# Patient Record
Sex: Female | Born: 1965 | Race: White | Hispanic: No | Marital: Married | Smoking: Never smoker
Health system: Southern US, Community
[De-identification: ages and names within clinical notes are randomized; demographics above are authoritative.]

## PROBLEM LIST (undated history)

## (undated) DIAGNOSIS — M2550 Pain in unspecified joint: Secondary | ICD-10-CM

## (undated) DIAGNOSIS — N3941 Urge incontinence: Secondary | ICD-10-CM

## (undated) DIAGNOSIS — F32A Depression, unspecified: Secondary | ICD-10-CM

## (undated) DIAGNOSIS — G8929 Other chronic pain: Secondary | ICD-10-CM

## (undated) DIAGNOSIS — M351 Other overlap syndromes: Secondary | ICD-10-CM

## (undated) DIAGNOSIS — D128 Benign neoplasm of rectum: Secondary | ICD-10-CM

## (undated) DIAGNOSIS — F419 Anxiety disorder, unspecified: Secondary | ICD-10-CM

## (undated) DIAGNOSIS — F329 Major depressive disorder, single episode, unspecified: Secondary | ICD-10-CM

## (undated) DIAGNOSIS — H04129 Dry eye syndrome of unspecified lacrimal gland: Secondary | ICD-10-CM

---

## 1990-07-14 HISTORY — PX: NASAL SINUS SURGERY: SHX719

## 1998-08-17 ENCOUNTER — Other Ambulatory Visit: Admission: RE | Admit: 1998-08-17 | Discharge: 1998-08-17 | Payer: Self-pay | Admitting: Obstetrics and Gynecology

## 1999-03-09 ENCOUNTER — Encounter (INDEPENDENT_AMBULATORY_CARE_PROVIDER_SITE_OTHER): Payer: Self-pay | Admitting: Specialist

## 1999-03-09 ENCOUNTER — Inpatient Hospital Stay (HOSPITAL_COMMUNITY): Admission: AD | Admit: 1999-03-09 | Discharge: 1999-03-11 | Payer: Self-pay | Admitting: Obstetrics and Gynecology

## 1999-04-24 ENCOUNTER — Other Ambulatory Visit: Admission: RE | Admit: 1999-04-24 | Discharge: 1999-04-24 | Payer: Self-pay | Admitting: Obstetrics and Gynecology

## 2000-05-26 ENCOUNTER — Other Ambulatory Visit: Admission: RE | Admit: 2000-05-26 | Discharge: 2000-05-26 | Payer: Self-pay | Admitting: Obstetrics and Gynecology

## 2001-08-10 ENCOUNTER — Other Ambulatory Visit: Admission: RE | Admit: 2001-08-10 | Discharge: 2001-08-10 | Payer: Self-pay | Admitting: Obstetrics and Gynecology

## 2002-10-10 ENCOUNTER — Other Ambulatory Visit: Admission: RE | Admit: 2002-10-10 | Discharge: 2002-10-10 | Payer: Self-pay | Admitting: Obstetrics and Gynecology

## 2003-11-30 ENCOUNTER — Other Ambulatory Visit: Admission: RE | Admit: 2003-11-30 | Discharge: 2003-11-30 | Payer: Self-pay | Admitting: Obstetrics and Gynecology

## 2004-03-13 ENCOUNTER — Ambulatory Visit (HOSPITAL_COMMUNITY): Admission: RE | Admit: 2004-03-13 | Discharge: 2004-03-13 | Payer: Self-pay | Admitting: Family Medicine

## 2004-03-25 ENCOUNTER — Ambulatory Visit (HOSPITAL_COMMUNITY): Admission: RE | Admit: 2004-03-25 | Discharge: 2004-03-25 | Payer: Self-pay | Admitting: Family Medicine

## 2004-04-09 ENCOUNTER — Ambulatory Visit (HOSPITAL_COMMUNITY): Admission: RE | Admit: 2004-04-09 | Discharge: 2004-04-09 | Payer: Self-pay | Admitting: Family Medicine

## 2004-12-16 ENCOUNTER — Other Ambulatory Visit: Admission: RE | Admit: 2004-12-16 | Discharge: 2004-12-16 | Payer: Self-pay | Admitting: Obstetrics and Gynecology

## 2006-04-08 IMAGING — CT CT PELVIS W/ CM
1 of 2 series · 14 of 32 positions shown, 18 images · IV contrast (omnipaque)
Comparison: none

CLINICAL DATA: Abdominal pain / diarrhea.  
 CT ABDOMEN AND PELVIS WITH CONTRAST
TECHNIQUE: Multidetector helical imaging carried out through the abdomen and pelvis utilizing oral and IV contrast ? 100 cc Omnipaque 300.  
 ABDOMEN WITH CONTRAST 
 Lung bases clear.  There is a single lesion of the liver in the posterolateral aspect of the right lobe, seen on images 25 through 27 of the initial series, and 14 through 16 of the delayed series.   The lesion measures approximately 18 x 10 mm.  It is ovoid in shape and morphologically looks like a cyst.  However, Hounsfield units on both the early and delayed images, are in the 40s to 50s.  It does not significantly ?fill in? on the delayed images, suggesting that it is not a hemangioma.  I have no other imaging studies on this patient.  If the patient is not known to have this lesion based on other exams, MRI is warranted to further characterize this lesion.  There are no other lesions of the liver.  Spleen, pancreas, and adrenals normal.  Early and delayed renal imaging showed no parenchymal mass, parenchymal lesions or obstruction.  No adenopathy or ascites.  
 IMPRESSION
 Normal except for an 18 x 10 mm lesion in the right lobe of the liver that does not appear to be a simple cyst. 
 PELVIS WITH CONTRAST
 I cannot image the appendix.  I see no inflammatory changes to suggest appendicitis.  However, there may be some slight thickening in the wall of the distal and terminal ileum.  This is best seen on image #67 in the terminal ileum. 
 The distal ileum either shows wall thickening or a small amount of pelvic ascites.  In fact there is some free fluid in the cul-de-sac and so the findings of the distal ileum are soft.  The questionable findings in the terminal ileum are also nondefinitive.  Given the patient's history of diarrhea, one might consider a small bowel follow-through for further assessment, if inflammatory bowel disease is a consideration. 
 Otherwise there are no pathological findings. 
 There is a small amount of fluid in the cul-de-sac, and there are possible subtle changes of inflammatory bowel disease in the distal and terminal ileum.  See above discussion.

[Series 2: abd pelvis · axial · 0.62mm/px · z∈[-425,-60]mm · 14 of 80 slices shown, 18 images]
[im 4/80  soft-tissue]
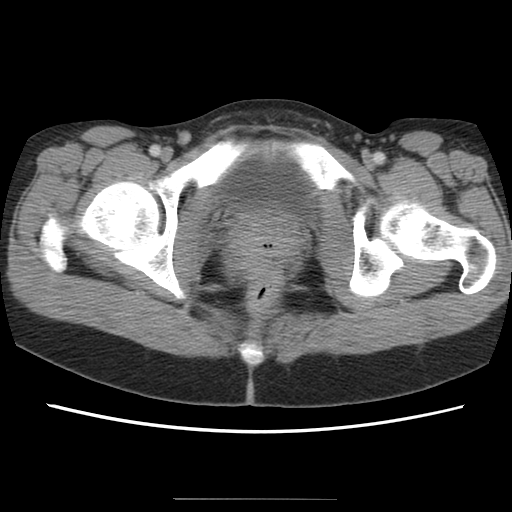
[im 4/80  bone]
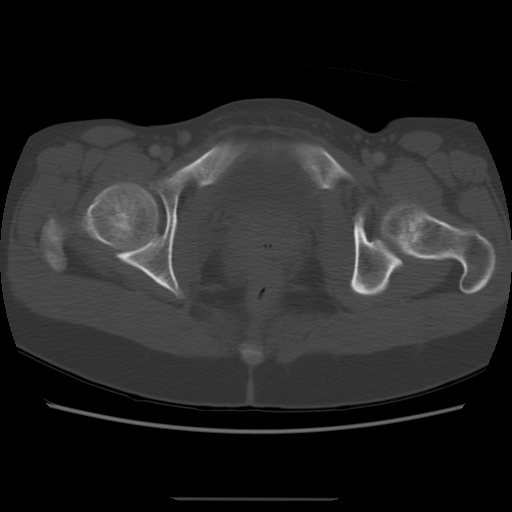
[im 12/80  soft-tissue]
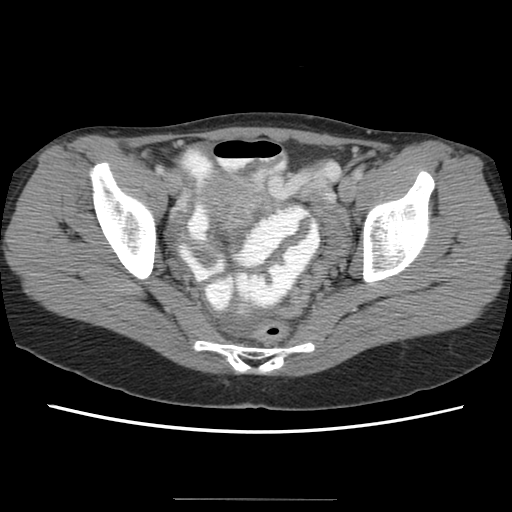
[im 19/80  soft-tissue]
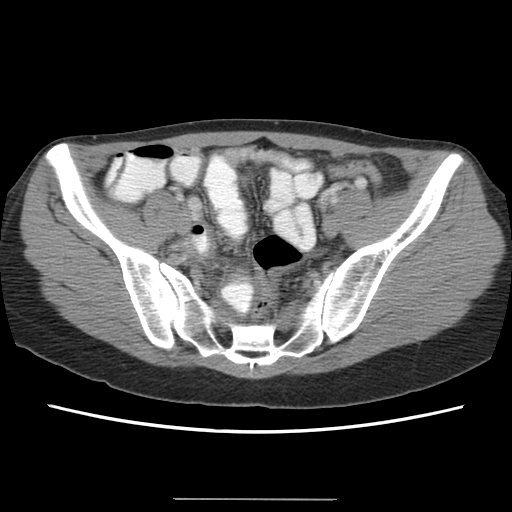
[im 23/80  soft-tissue]
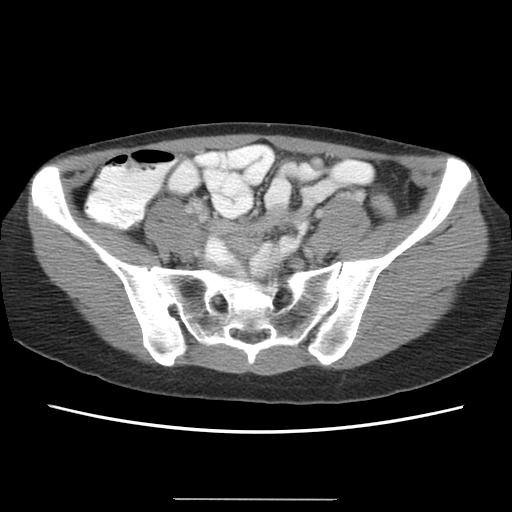
[im 31/80  soft-tissue]
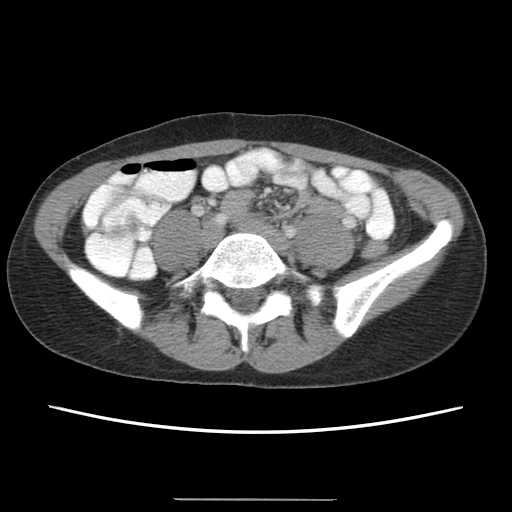
[im 38/80  soft-tissue]
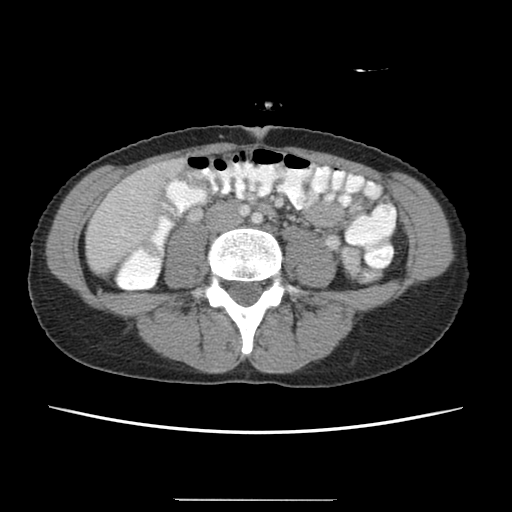
[im 42/80  soft-tissue]
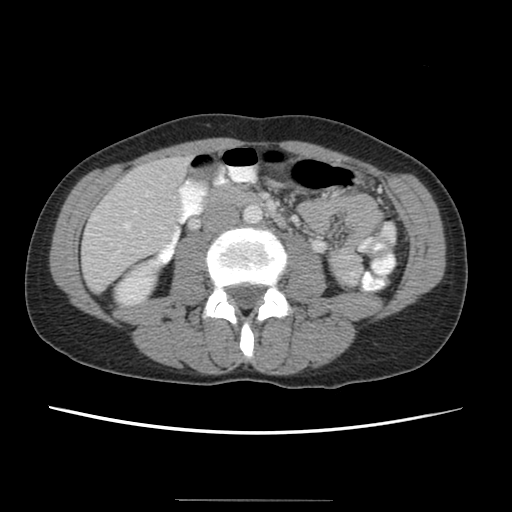
[im 49/80  soft-tissue]
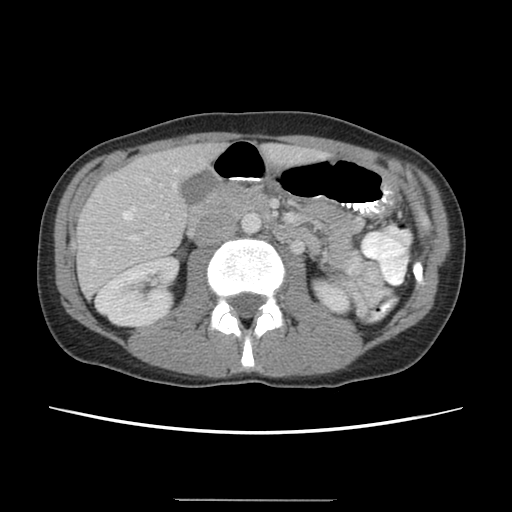
[im 57/80  soft-tissue]
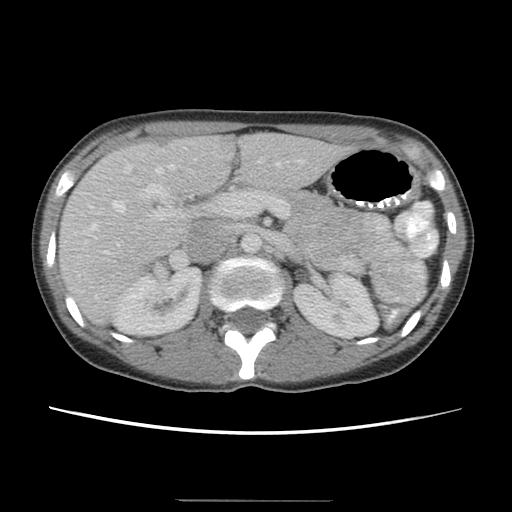
[im 57/80  bone]
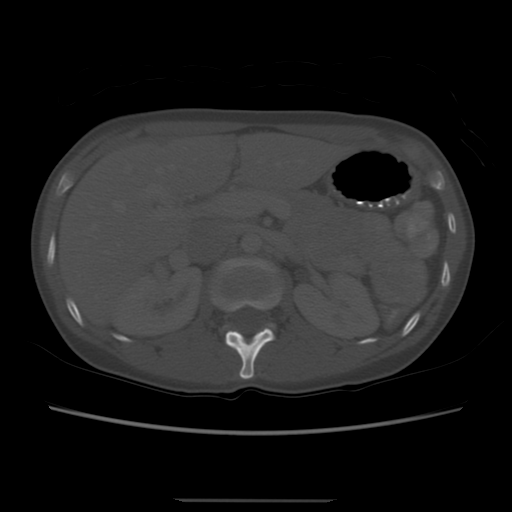
[im 61/80  soft-tissue]
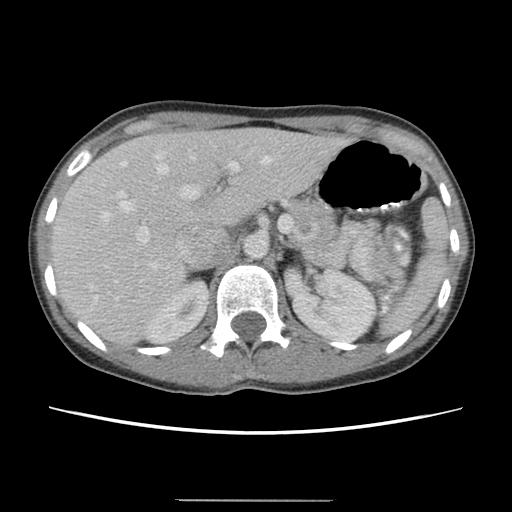
[im 64/80  lung]
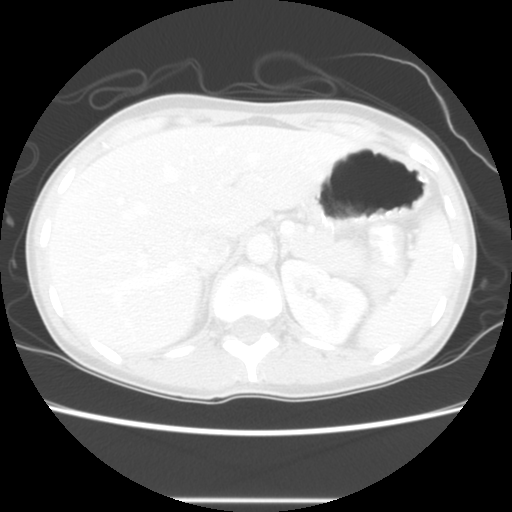
[im 68/80  soft-tissue]
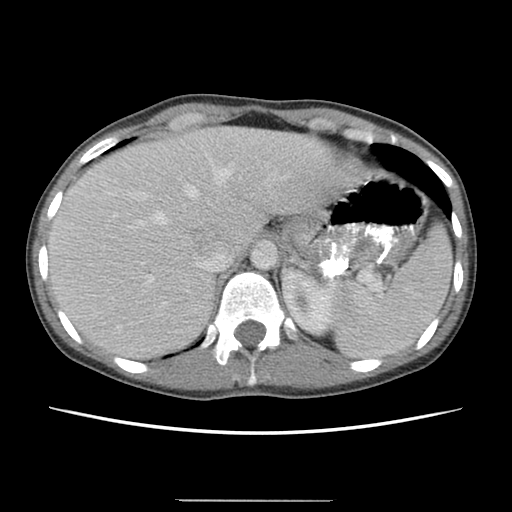
[im 68/80  lung]
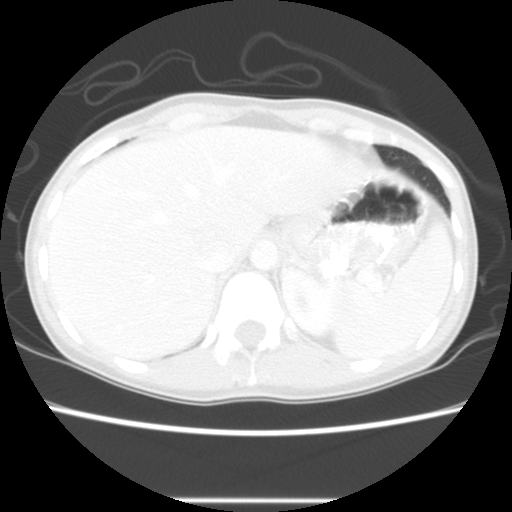
[im 72/80  lung]
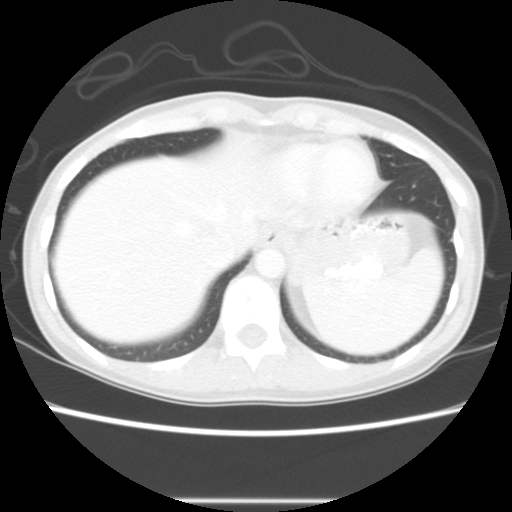
[im 76/80  soft-tissue]
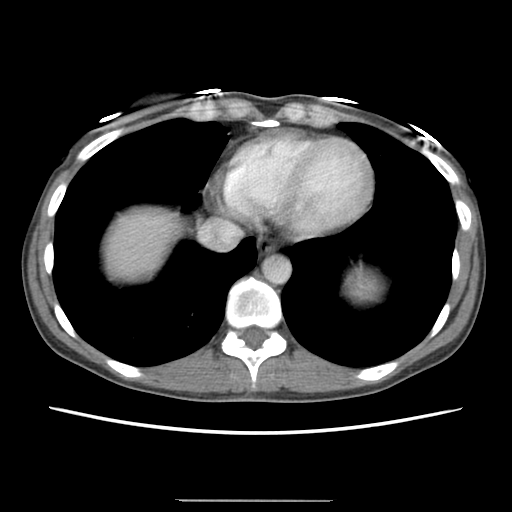
[im 76/80  lung]
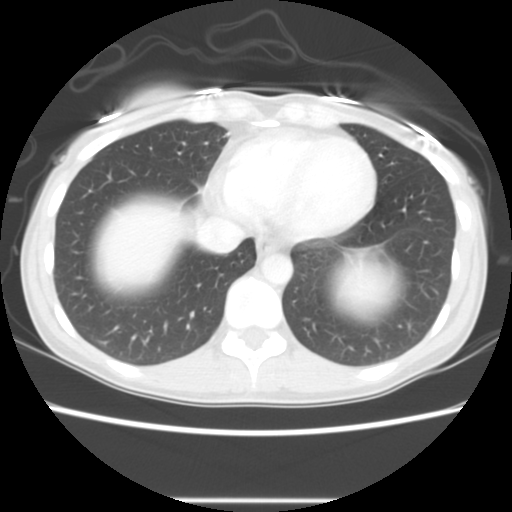

[14 of 32 positions shown; findings below may reference images not displayed]

## 2007-05-11 ENCOUNTER — Other Ambulatory Visit: Admission: RE | Admit: 2007-05-11 | Discharge: 2007-05-11 | Payer: Self-pay | Admitting: Obstetrics and Gynecology

## 2007-06-02 ENCOUNTER — Encounter: Admission: RE | Admit: 2007-06-02 | Discharge: 2007-06-02 | Payer: Self-pay | Admitting: Obstetrics and Gynecology

## 2009-06-27 IMAGING — MG MM SCREEN MAMMOGRAM BILATERAL
4 series · 4 of 4 positions shown · non-contrast
Comparison: none

DG SCREEN MAMMOGRAM BILATERAL
Bilateral CC and MLO view(s) were taken.

DIGITAL SCREENING MAMMOGRAM WITH CAD:
The breast tissue is extremely dense.  No masses or malignant type calcifications are identified.  
Compared with prior studies.

[R CC]
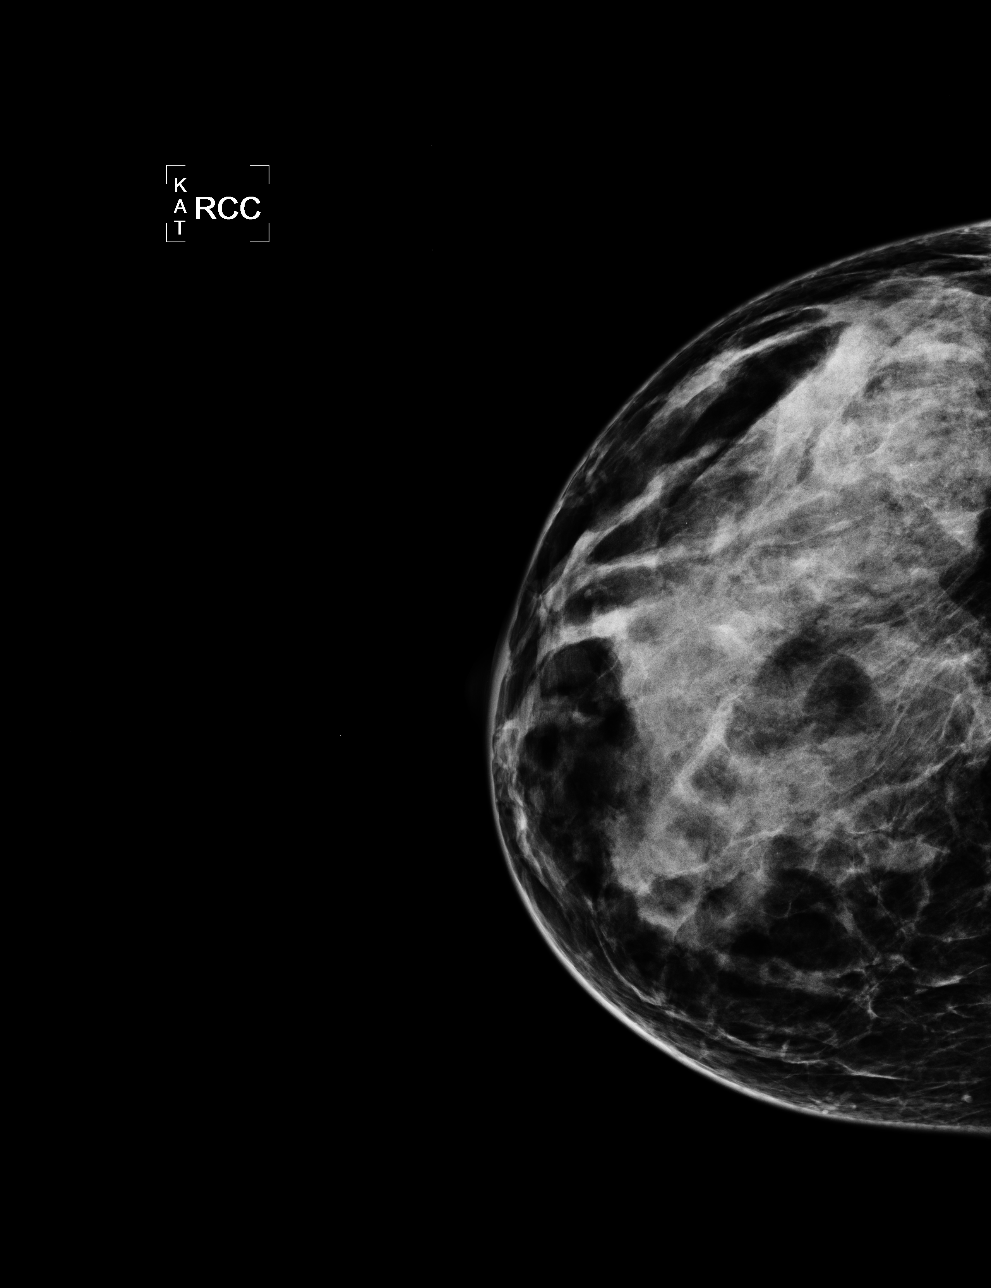

[L CC]
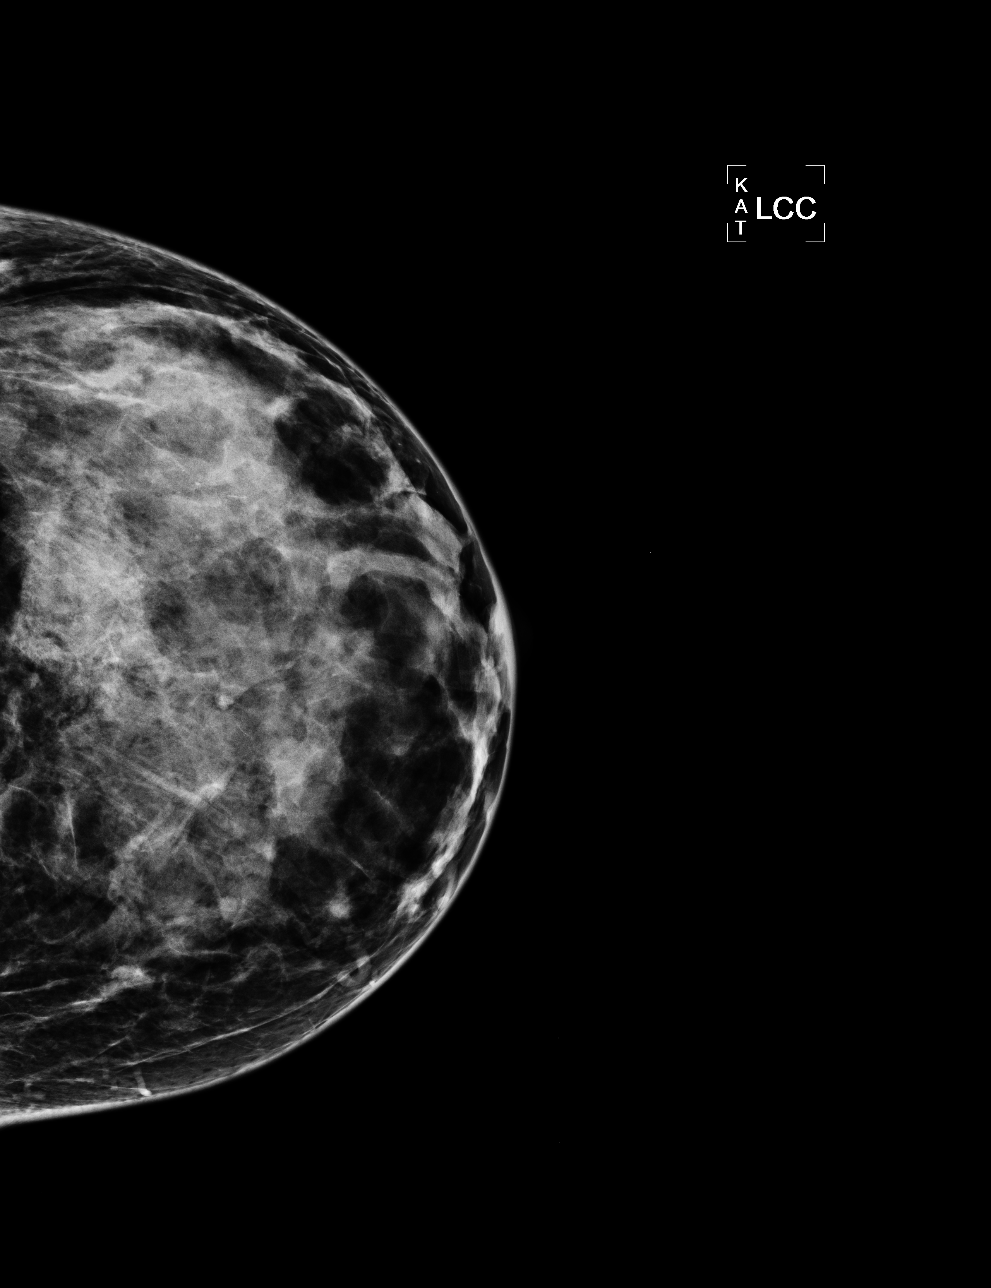

[L MLO]
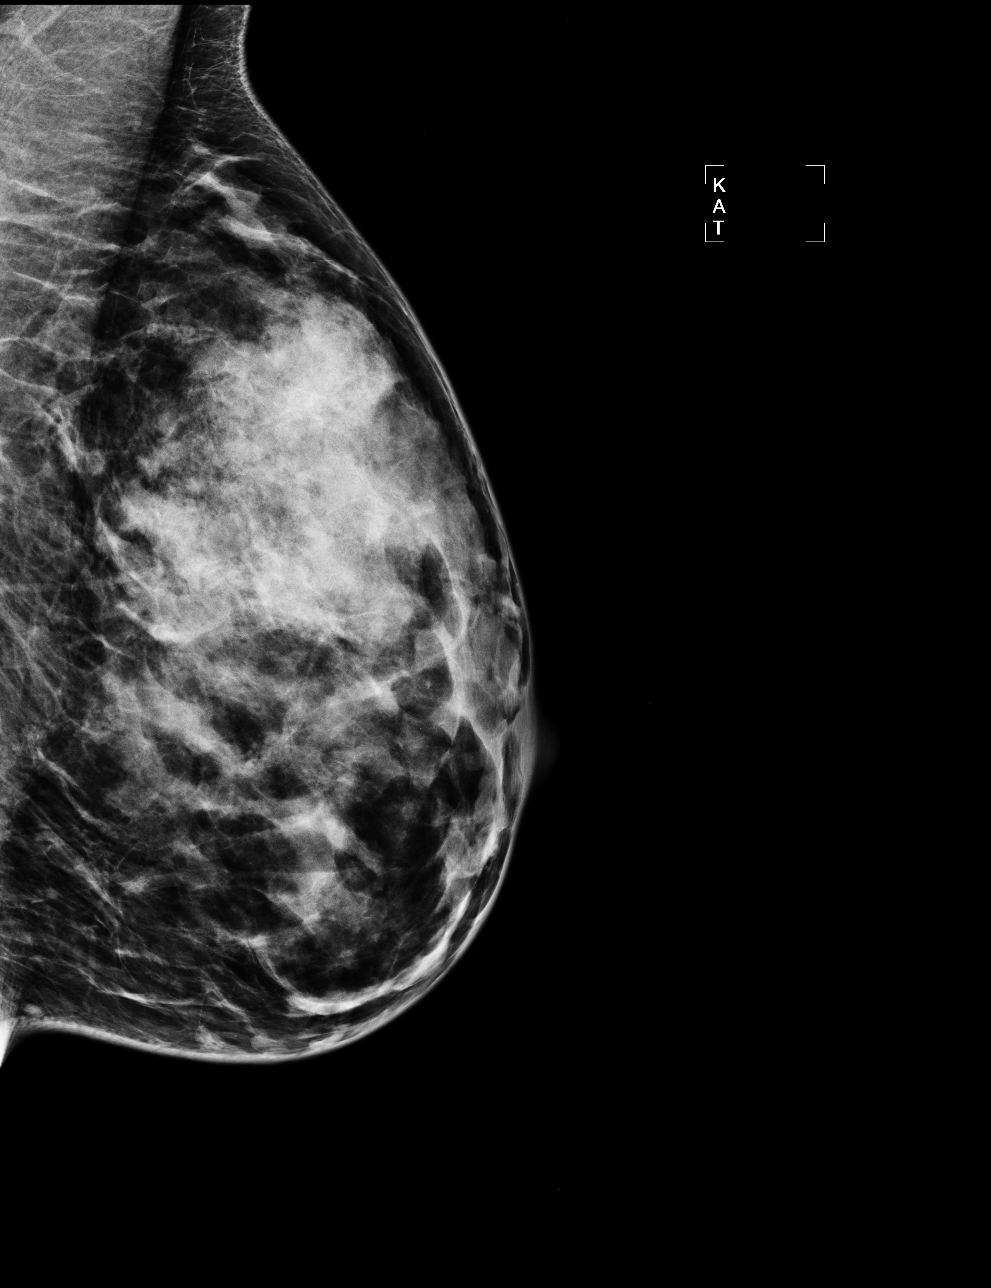

[R MLO]
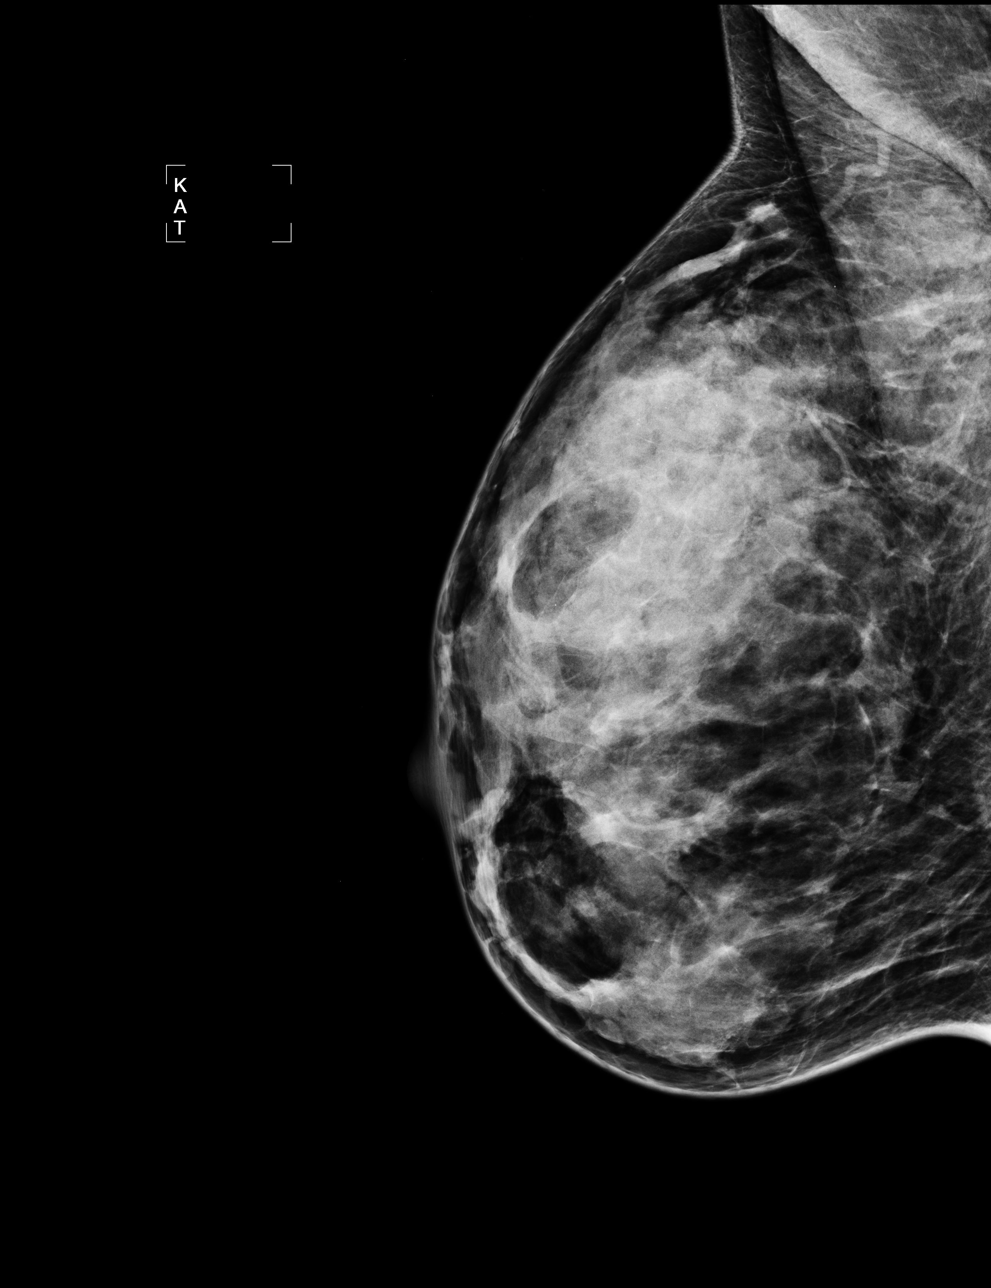

[4 of 4 positions shown; findings below may reference images not displayed]

IMPRESSION: No specific mammographic evidence of malignancy.  Next screening mammogram is recommended in one 
year.

ASSESSMENT: Negative - BI-RADS 1

Screening mammogram in 1 year.
ANALYZED BY COMPUTER AIDED DETECTION. , THIS PROCEDURE WAS A DIGITAL MAMMOGRAM.

## 2010-08-04 ENCOUNTER — Encounter: Payer: Self-pay | Admitting: Obstetrics and Gynecology

## 2012-05-28 ENCOUNTER — Encounter (HOSPITAL_COMMUNITY): Payer: Self-pay | Admitting: *Deleted

## 2012-05-28 ENCOUNTER — Ambulatory Visit (HOSPITAL_COMMUNITY)
Admission: RE | Admit: 2012-05-28 | Discharge: 2012-05-28 | Disposition: A | Discharge status: Home / Self Care | Payer: 59 | Attending: Psychiatry | Admitting: Psychiatry

## 2012-05-28 DIAGNOSIS — F331 Major depressive disorder, recurrent, moderate: Secondary | ICD-10-CM | POA: Insufficient documentation

## 2012-05-28 DIAGNOSIS — F411 Generalized anxiety disorder: Secondary | ICD-10-CM | POA: Insufficient documentation

## 2012-05-28 NOTE — H&P (Signed)
Behavioral Health Medical Screening Exam  Crystal Davenport is an 46 y.o. female.  Review of Systems  Constitutional: Negative.   HENT: Negative for hearing loss, ear pain, nosebleeds, congestion, sore throat, tinnitus and ear discharge.   Eyes: Negative.   Respiratory: Negative.  Negative for stridor.   Cardiovascular: Positive for chest pain and palpitations. Negative for orthopnea, claudication, leg swelling and PND.       States during panic attack will get chest pain and fast heart beat  Gastrointestinal: Negative.   Genitourinary: Negative.   Musculoskeletal: Negative.   Skin: Negative.   Neurological: Positive for headaches (States that she has headache now and had one yesterday.  States that she does not have  hx of migraines).  Endo/Heme/Allergies: Negative.   Psychiatric/Behavioral: Positive for depression (Rates depression 8/10). Negative for suicidal ideas (Denies SI and HI), hallucinations and memory loss. The patient is nervous/anxious (Rates anxiety 9/10). The patient does not have insomnia.     Physical Exam  Constitutional: She is oriented to person, place, and time. She appears well-developed and well-nourished.  HENT:  Head: Normocephalic.  Eyes: Pupils are equal, round, and reactive to light. Right eye exhibits no discharge. Left eye exhibits no discharge.  Neck: Normal range of motion.  Cardiovascular: Normal rate, regular rhythm, normal heart sounds and intact distal pulses.  Exam reveals no gallop and no friction rub.   No murmur heard.      Patient blood pressure right arm sitting 167/90 and left arm sitting 137/92.  Patient stated that she was told by a doctor that her blood pressure was increasing but had never been treated for HTN.  Discussed monitoring blood pressure at home and recording it.  Take the recordings to PCP.  Informed to set up an appointment with PCP to assess blood pressure  Respiratory: Effort normal and breath sounds normal. No respiratory  distress. She has no wheezes. She has no rales. She exhibits no tenderness.  GI: Soft. Bowel sounds are normal.  Musculoskeletal: Normal range of motion.  Neurological: She is alert and oriented to person, place, and time. She has normal reflexes. She displays normal reflexes. She exhibits normal muscle tone.  Skin: Skin is warm and dry.  Psychiatric:       Patient mood/affect: Tearful and depressed Behavior: abnormal (depressed) Thought content: coherent Judgement: Normal Patient stated that she was experience depression related to several changes a house full of teenager and an elderly who was suffering from anxiety/depression and changes at the work place.  Stated that she did not feel that she needed inpatient care just wanted to set up something on an outpatient basis.    There were no vitals taken for this visit.  Recommendations:  Based on my evaluation the patient does not appear to have an emergency medical condition. Blood pressure discussed with patient.  Patient will monitor BP at home and set up an appontment with PCP.  Resource information given to patient for OP treatment.  Also infromed patient if she reached the point that she felt that she would benifit from inpatient treatment that she could  come back and enter voluntary.  Pateint was able to sight the no harm contract and patient denies SI, HI and HA.  Crystal Davenport 05/28/2012, 12:52 PM

## 2012-05-28 NOTE — BH Assessment (Signed)
Assessment Note   Crystal Davenport is a 46 y.o. married white female.  She presents at [facility] complaining of depression, adding that she feels "rejected by God."  Pt notes several stressors, starting with estrangement from her mother who has been a Chartered loss adjuster for years, and who neglected pt in childhood.  Her father left the household when pt was 25 y/o and had little contact with her thereafter.  He died abruptly of cancer on 27-Apr-2012 after a short course of treatment, and pt feels that her issues with him are unresolved.  Moreover, in making funeral arrangements, his wife made little to no effort to accommodate the needs of the pt and her siblings.  Pt reports that her daughters, ages 76, 46, and 14, are very intelligent and high achieving, and the family has made sacrifices on their behalves, including a recent home sale and move into a better school district.  This entailed purchasing a larger home that pt characterizes as a "fixer upper."  The move was very stressful, and pt states, "it was hell."  Yesterday, 05/27/2012, pt discovered that the 35 y/o daughter has been procrastinating on completing the college application process to the point that this may jeopardize her academic future.  Pt confronted the daughter, who responded by telling pt that she hates her, which was very upsetting to the pt.  Pt endorses depression with symptoms as indicated in the "risk to self" assessment below.  These have worsened significantly since the death of her father.  Pt states, "I'm not doing anybody any good."  She endorses SI, but only passive, saying, "I just wish I would die naturally."  She denies having a suicide plan, or any history of suicide attempts or of self mutilation. She is capable of contracting for safety at this time.  She denies any current or past history of HI, physical aggression, AH/VH, or substance abuse.  She demonstrates no delusional thought at this time.  Pt holds a bachelors degree in psychology,  and works as an Print production planner for [employer].  She finds the work very gratifying and is on good terms with therapists with whom she works: "Work is the one place where I do feel appreciated."  Pt reports that her coworkers have some understanding of her depression, but not of its severity.  She identifies her spouse as her main social support, and their relationship is intact, although she reports that she has recently placed more responsibility on him.  Pt has never been hospitalized for psychiatric treatment.  She and her spouse saw an outpt therapist in the early 1990's prior to being married.  Pt attributed this to the dysfunction in which she grew up, saying that she wanted her spouse to understand what he was getting into with her.  Last spring, pt's daughter saw a therapist for two sessions, and pt also saw the therapist for a single session, but mostly in the interest of advancing the daughter's treatment.  Pt is prescribed Xanax by her PCP.  She does not see a psychiatrist.  Axis I: Major Depressive Disorder, recurrent, moderate 296.32; Anxiety Disorder NOS 300.00 Axis II: Deferred 799.9 Axis III:  Past Medical History  Diagnosis Date  . Hypertension 05/28/2012    Intermittant problem   Axis IV: housing problems, problems with primary support group and parent-child relational problems and problems related to grieving Axis V: GAF = 45  Past Medical History:  Past Medical History  Diagnosis Date  . Hypertension 05/28/2012  Intermittant problem    No past surgical history on file.  Family History: No family history on file.  Social History:  reports that she has never smoked. She has never used smokeless tobacco. She reports that she does not drink alcohol or use illicit drugs.  Additional Social History:  Alcohol / Drug Use Pain Medications: Denies Prescriptions: Denies Over the Counter: Denies History of alcohol / drug use?: No history of alcohol / drug abuse  CIWA:    COWS:    Allergies: Allergies no known allergies  Home Medications:  (Not in a hospital admission)  OB/GYN Status:  No LMP recorded.  General Assessment Data Location of Assessment: [facility] Living Arrangements: Spouse/significant other;Children (Spouse, daughters ages 15, 40, 37 y/o) Can pt return to current living arrangement?: Yes Admission Status: Voluntary Is patient capable of signing voluntary admission?: Yes Transfer from: Home Referral Source: Self/Family/Friend  Education Status Highest grade of school patient has completed: Bachelor's degree  Risk to self Suicidal Ideation: Yes-Currently Present Suicidal Intent: No Is patient at risk for suicide?: No Suicidal Plan?: No Access to Means: No What has been your use of drugs/alcohol within the last 12 months?: Denies Previous Attempts/Gestures: No How many times?: 0  Other Self Harm Risks: Reports passive SI only: "I just wish I would die naturally."  Contracts for safety. Triggers for Past Attempts: Other (Comment) (Not applicable) Intentional Self Injurious Behavior: None Family Suicide History: No (Mom & MGM: hoarding; daughter: anxiety) Recent stressful life event(s): Conflict (Comment);Loss (Comment);Other (Comment) (Daughter told pt she hates her; recent move into fixer upper) Persecutory voices/beliefs?: No Depression: Yes Depression Symptoms: Tearfulness;Fatigue;Guilt;Loss of interest in usual pleasures;Feeling worthless/self pity;Feeling angry/irritable (Hopelessness; "I feel rejected by God.") Substance abuse history and/or treatment for substance abuse?: No Suicide prevention information given to non-admitted patients: Yes  Risk to Others Homicidal Ideation: No Thoughts of Harm to Others: No Current Homicidal Intent: No Current Homicidal Plan: No Access to Homicidal Means: No Identified Victim: None History of harm to others?: No Assessment of Violence: None Noted Violent Behavior  Description: Calm/cooperative Does patient have access to weapons?: No (Denies access to guns.) Criminal Charges Pending?: No Does patient have a court date: No  Psychosis Hallucinations: None noted Delusions: None noted  Mental Status Report Appear/Hygiene:  (Neat, well groomed) Eye Contact: Fair Motor Activity: Unremarkable Speech: Other (Comment) (Unremarkable) Level of Consciousness: Alert Mood: Depressed;Other (Comment) (Tearful) Affect: Appropriate to circumstance Anxiety Level: None (Recent severe anxiety.) Thought Processes: Coherent;Relevant Judgement: Unimpaired Orientation: Person;Place;Time;Situation Obsessive Compulsive Thoughts/Behaviors: None  Cognitive Functioning Concentration: Normal Memory: Recent Intact;Remote Intact IQ: Average Insight: Fair Impulse Control: Good (Reports that coming to [facility] is her most impulsive behavior.) Appetite: Good Weight Loss: 0  Weight Gain: 0  Sleep: No Change Total Hours of Sleep: 7  Vegetative Symptoms: None  ADLScreening [facility]) Patient's cognitive ability adequate to safely complete daily activities?: Yes Patient able to express need for assistance with ADLs?: Yes Independently performs ADLs?: Yes (appropriate for developmental age)  Abuse/Neglect [facility]) Physical Abuse: Denies Verbal Abuse: Yes, past (Comment) (Mother is a Chartered loss adjuster; parents neglected pt in childhood.) Sexual Abuse: Denies  Prior Inpatient Therapy Prior Inpatient Therapy: No Prior Therapy Dates: None Prior Therapy Facilty/Provider(s): None Reason for Treatment: None  Prior Outpatient Therapy Prior Outpatient Therapy: Yes Prior Therapy Dates: Early 1990's: pre-marital counseling; pt discussed Hx of conflict with parents. Prior Therapy Facilty/Provider(s): Spring of 2013: spoke with daughter's counselor individually.  ADL Screening (condition at time of admission)  Patient's cognitive ability adequate to safely complete daily  activities?: Yes Patient able to express need for assistance with ADLs?: Yes Independently performs ADLs?: Yes (appropriate for developmental age) Weakness of Legs: None Weakness of Arms/Hands: None  Home Assistive Devices/Equipment Home Assistive Devices/Equipment: Contact lenses    Abuse/Neglect Assessment (Assessment to be complete while patient is alone) Physical Abuse: Denies Verbal Abuse: Yes, past (Comment) (Mother is a Chartered loss adjuster; parents neglected pt in childhood.) Sexual Abuse: Denies Self-Neglect: Denies Values / Beliefs Cultural Requests During Hospitalization: Other (comment) (Member of a church; feels rejected by God.)   Merchant navy officer (For Healthcare) Advance Directive: Patient does not have advance directive;Patient would like information Patient requests advance directive information: Advance directive packet given Pre-existing out of facility DNR order (yellow form or pink MOST form): No Nutrition Screen- [facility] Adult/WL/AP Patient's home diet: Regular Have you recently lost weight without trying?: No Have you been eating poorly because of a decreased appetite?: No Malnutrition Screening Tool Score: 0   Additional Information 1:1 In Past 12 Months?: No CIRT Risk: No Elopement Risk: No Does patient have medical clearance?: No     Disposition:  Disposition Disposition of Patient: Outpatient treatment Type of outpatient treatment: Psych Intensive Outpatient [facility]) Patient referred to: Other (Comment) (Also,[facility]) After consulting with Shuvon it was determined that pt is not currently a danger to others, nor to herself provided that she is able to contract for safety.  Pt signed Engineer, manufacturing systems.  Several outpt treatment options were discussed with pt including [facility] and routine outpt psychiatry and counseling.  Pt accepted printed information about [facility]-IOP, as  well as written referrals to [facility], [facility], and [place], [place].  She departed at 13:13 following MSE by [facility].  On Site Evaluation by:   Reviewed with Physician:  Assunta Found, FNP @ 12:50   Raphael Gibney 05/28/2012 2:43 PM

## 2013-07-14 HISTORY — PX: OTHER SURGICAL HISTORY: SHX169

## 2013-10-11 ENCOUNTER — Emergency Department (HOSPITAL_COMMUNITY)
Admission: EM | Admit: 2013-10-11 | Discharge: 2013-10-11 | Disposition: A | Discharge status: Home / Self Care | Payer: 59 | Attending: Emergency Medicine | Admitting: Emergency Medicine

## 2013-10-11 ENCOUNTER — Encounter (HOSPITAL_COMMUNITY): Payer: Self-pay | Admitting: Emergency Medicine

## 2013-10-11 DIAGNOSIS — T438X2A Poisoning by other psychotropic drugs, intentional self-harm, initial encounter: Secondary | ICD-10-CM

## 2013-10-11 DIAGNOSIS — IMO0002 Reserved for concepts with insufficient information to code with codable children: Secondary | ICD-10-CM | POA: Insufficient documentation

## 2013-10-11 DIAGNOSIS — T4271XA Poisoning by unspecified antiepileptic and sedative-hypnotic drugs, accidental (unintentional), initial encounter: Secondary | ICD-10-CM | POA: Insufficient documentation

## 2013-10-11 DIAGNOSIS — T424X4A Poisoning by benzodiazepines, undetermined, initial encounter: Secondary | ICD-10-CM | POA: Insufficient documentation

## 2013-10-11 DIAGNOSIS — T43502A Poisoning by unspecified antipsychotics and neuroleptics, intentional self-harm, initial encounter: Secondary | ICD-10-CM | POA: Insufficient documentation

## 2013-10-11 DIAGNOSIS — Z3202 Encounter for pregnancy test, result negative: Secondary | ICD-10-CM | POA: Insufficient documentation

## 2013-10-11 DIAGNOSIS — F329 Major depressive disorder, single episode, unspecified: Secondary | ICD-10-CM

## 2013-10-11 DIAGNOSIS — T50901A Poisoning by unspecified drugs, medicaments and biological substances, accidental (unintentional), initial encounter: Secondary | ICD-10-CM

## 2013-10-11 DIAGNOSIS — R45851 Suicidal ideations: Secondary | ICD-10-CM | POA: Insufficient documentation

## 2013-10-11 DIAGNOSIS — T1491XA Suicide attempt, initial encounter: Secondary | ICD-10-CM

## 2013-10-11 DIAGNOSIS — I10 Essential (primary) hypertension: Secondary | ICD-10-CM | POA: Insufficient documentation

## 2013-10-11 DIAGNOSIS — F3289 Other specified depressive episodes: Secondary | ICD-10-CM | POA: Insufficient documentation

## 2013-10-11 DIAGNOSIS — T43224A Poisoning by selective serotonin reuptake inhibitors, undetermined, initial encounter: Secondary | ICD-10-CM | POA: Insufficient documentation

## 2013-10-11 DIAGNOSIS — Z79899 Other long term (current) drug therapy: Secondary | ICD-10-CM | POA: Insufficient documentation

## 2013-10-11 DIAGNOSIS — F32A Depression, unspecified: Secondary | ICD-10-CM

## 2013-10-11 HISTORY — DX: Depression, unspecified: F32.A

## 2013-10-11 HISTORY — DX: Major depressive disorder, single episode, unspecified: F32.9

## 2013-10-11 LAB — SALICYLATE LEVEL: Salicylate Lvl: <2 mg/dL — ABNORMAL LOW (ref 2.8–20.0)

## 2013-10-11 LAB — POC URINE PREG, ED: PREG TEST UR: NEGATIVE

## 2013-10-11 LAB — COMPREHENSIVE METABOLIC PANEL
ALT: 9 U/L (ref 0–35)
AST: 19 U/L (ref 0–37)
Albumin: 3.8 g/dL (ref 3.5–5.2)
Alkaline Phosphatase: 40 U/L (ref 39–117)
BILIRUBIN TOTAL: 0.6 mg/dL (ref 0.3–1.2)
BUN: 14 mg/dL (ref 6–23)
CALCIUM: 9.5 mg/dL (ref 8.4–10.5)
CO2: 22 meq/L (ref 19–32)
Chloride: 104 mEq/L (ref 96–112)
Creatinine, Ser: 0.87 mg/dL (ref 0.50–1.10)
GFR calc non Af Amer: 77 mL/min — ABNORMAL LOW (ref >90)
GFR, EST AFRICAN AMERICAN: 89 mL/min — AB (ref >90)
GLUCOSE: 94 mg/dL (ref 70–99)
POTASSIUM: 4.3 meq/L (ref 3.7–5.3)
Sodium: 138 mEq/L (ref 137–147)
TOTAL PROTEIN: 7 g/dL (ref 6.0–8.3)

## 2013-10-11 LAB — CBG MONITORING, ED: Glucose-Capillary: 79 mg/dL (ref 70–99)

## 2013-10-11 LAB — RAPID URINE DRUG SCREEN, HOSP PERFORMED
Amphetamines: NOT DETECTED
BENZODIAZEPINES: POSITIVE — AB
Barbiturates: NOT DETECTED
Cocaine: NOT DETECTED
Opiates: NOT DETECTED
Tetrahydrocannabinol: NOT DETECTED

## 2013-10-11 LAB — CBC
HEMATOCRIT: 39 % (ref 36.0–46.0)
Hemoglobin: 13 g/dL (ref 12.0–15.0)
MCH: 29.7 pg (ref 26.0–34.0)
MCHC: 33.3 g/dL (ref 30.0–36.0)
MCV: 89.2 fL (ref 78.0–100.0)
PLATELETS: 190 10*3/uL (ref 150–400)
RBC: 4.37 MIL/uL (ref 3.87–5.11)
RDW: 13.1 % (ref 11.5–15.5)
WBC: 6.3 10*3/uL (ref 4.0–10.5)

## 2013-10-11 LAB — ETHANOL: Alcohol, Ethyl (B): <11 mg/dL (ref 0–11)

## 2013-10-11 LAB — ACETAMINOPHEN LEVEL: Acetaminophen (Tylenol), Serum: <15 ug/mL (ref 10–30)

## 2013-10-11 NOTE — ED Notes (Addendum)
Pt states she felt overwhelmed with 3 teenage daughters but currently denies SI.  Pt also states she already has an appt with a mental health provider.  Pt made aware of need for urine sample.

## 2013-10-11 NOTE — ED Notes (Signed)
MD at bedside. 

## 2013-10-11 NOTE — ED Notes (Signed)
Pt's family reports pt took 6 Ambiens, 6 xanaxs, and 1 prozac.

## 2013-10-11 NOTE — ED Notes (Signed)
Pt states she is depressed and over dosed on her and her husbands medication,  She left a note also with empty pill bottles,  Her husband brought her in with note and empty pill bottles,  Pt states she feels it would be better if she was dead.  Pt is sleepy ,  Eyes slightly swollen and mouth dry.  Her gait is unsteady.

## 2013-10-11 NOTE — Discharge Instructions (Signed)
Depression, Adult Depression refers to feeling sad, low, down in the dumps, blue, gloomy, or empty. In general, there are two kinds of depression: 1. Depression that we all experience from time to time because of upsetting life experiences, including the loss of a job or the ending of a relationship (normal sadness or normal grief). This kind of depression is considered normal, is short lived, and resolves within a few days to 2 weeks. (Depression experienced after the loss of a loved one is called bereavement. Bereavement often lasts longer than 2 weeks but normally gets better with time.) 2. Clinical depression, which lasts longer than normal sadness or normal grief or interferes with your ability to function at home, at work, and in school. It also interferes with your personal relationships. It affects almost every aspect of your life. Clinical depression is an illness. Symptoms of depression also can be caused by conditions other than normal sadness and grief or clinical depression. Examples of these conditions are listed as follows:  Physical illness Some physical illnesses, including underactive thyroid gland (hypothyroidism), severe anemia, specific types of cancer, diabetes, uncontrolled seizures, heart and lung problems, strokes, and chronic pain are commonly associated with symptoms of depression.  Side effects of some prescription medicine In some people, certain types of prescription medicine can cause symptoms of depression.  Substance abuse Abuse of alcohol and illicit drugs can cause symptoms of depression. SYMPTOMS Symptoms of normal sadness and normal grief include the following:  Feeling sad or crying for short periods of time.  Not caring about anything (apathy).  Difficulty sleeping or sleeping too much.  No longer able to enjoy the things you used to enjoy.  Desire to be by oneself all the time (social isolation).  Lack of energy or motivation.  Difficulty  concentrating or remembering.  Change in appetite or weight.  Restlessness or agitation. Symptoms of clinical depression include the same symptoms of normal sadness or normal grief and also the following symptoms:  Feeling sad or crying all the time.  Feelings of guilt or worthlessness.  Feelings of hopelessness or helplessness.  Thoughts of suicide or the desire to harm yourself (suicidal ideation).  Loss of touch with reality (psychotic symptoms). Seeing or hearing things that are not real (hallucinations) or having false beliefs about your life or the people around you (delusions and paranoia). DIAGNOSIS  The diagnosis of clinical depression usually is based on the severity and duration of the symptoms. Your caregiver also will ask you questions about your medical history and substance use to find out if physical illness, use of prescription medicine, or substance abuse is causing your depression. Your caregiver also may order blood tests. TREATMENT  Typically, normal sadness and normal grief do not require treatment. However, sometimes antidepressant medicine is prescribed for bereavement to ease the depressive symptoms until they resolve. The treatment for clinical depression depends on the severity of your symptoms but typically includes antidepressant medicine, counseling with a mental health professional, or a combination of both. Your caregiver will help to determine what treatment is best for you. Depression caused by physical illness usually goes away with appropriate medical treatment of the illness. If prescription medicine is causing depression, talk with your caregiver about stopping the medicine, decreasing the dose, or substituting another medicine. Depression caused by abuse of alcohol or illicit drugs abuse goes away with abstinence from these substances. Some adults need professional help in order to stop drinking or using drugs. SEEK IMMEDIATE CARE IF:  You have  thoughts  about hurting yourself or others.  You lose touch with reality (have psychotic symptoms).  You are taking medicine for depression and have a serious side effect. FOR MORE INFORMATION National Alliance on Mental Illness: www.nami.[employer] of Mental Health: https://carter.com/ Document Released: 06/27/2000 Document Revised: 12/30/2011 Document Reviewed: 09/29/2011 [facility] Patient Information 2014 [place].  Suicidal Feelings, How to Help Yourself Everyone feels sad or unhappy at times, but depressing thoughts and feelings of hopelessness can lead to thoughts of suicide. It can seem as if life is too tough to handle. If you feel as though you have reached the point where suicide is the only answer, it is time to let someone know immediately.  HOW TO COPE AND PREVENT SUICIDE  Let family, friends, teachers, or counselors know. Get help. Try not to isolate yourself from those who care about you. Even though you may not feel sociable, talk with someone every day. It is best if it is face-to-face. Remember, they will want to help you.  Eat a regularly spaced and well-balanced diet.  Get plenty of rest.  Avoid alcohol and drugs because they will only make you feel worse and may also lower your inhibitions. Remove them from the home. If you are thinking of taking an overdose of your prescribed medicines, give your medicines to someone who can give them to you one day at a time. If you are on antidepressants, let your caregiver know of your feelings so he or she can provide a safer medicine, if that is a concern.  Remove weapons or poisons from your home.  Try to stick to routines. Follow a schedule and remind yourself that you have to keep that schedule every day.  Set some realistic goals and achieve them. Make a list and cross things off as you go. Accomplishments give a sense of worth. Wait until you are feeling better before doing things you find difficult or unpleasant to  do.  If you are able, try to start exercising. Even half-hour periods of exercise each day will make you feel better. Getting out in the sun or into nature helps you recover from depression faster. If you have a favorite place to walk, take advantage of that.  Increase safe activities that have always given you pleasure. This may include playing your favorite music, reading a good book, painting a picture, or playing your favorite instrument. Do whatever takes your mind off your depression.  Keep your living space well-lighted. GET HELP Contact a suicide hotline, crisis center, or local suicide prevention center for help right away. Local centers may include a hospital, clinic, community service organization, social service provider, or health department.  Call your local emergency services (911 in the [place]).  Call a suicide hotline:  1-800-273-TALK (1-(256) 682-7964) in the [place].  1-800-SUICIDE (319)535-8892) in the [place].  307-703-1194 in the [place] for Spanish-speaking counselors.  2-355-732-2GUR (818) 366-4935) in the [place] for TTY users.  Visit the following websites for information and help:  National Suicide Prevention Lifeline: www.suicidepreventionlifeline.org  Hopeline: www.hopeline.[place] for Suicide Prevention: PromotionalLoans.co.za  For lesbian, gay, bisexual, transgender, or questioning youth, contact [employer]:  3-151-7-O-HYWVPX 4060027227) in the [place].  www.thetrevorproject.org  In [place], treatment resources are listed in each [place] with listings available under USAA for [employer] or similar titles. Another source for Crisis Centres by [place] is located at http://www.suicideprevention.ca/in-crisis-now/find-a-crisis-centre-now/crisis-centres Document Released: 01/04/2003 Document Revised: 09/22/2011 Document Reviewed: 05/25/2007 ExitCare Patient Information  2014  [place], [place].

## 2013-10-12 NOTE — ED Provider Notes (Signed)
CSN: [id number]     Arrival date & time 10/11/13  1943 History   First MD Initiated Contact with Patient 10/11/13 2112     Chief Complaint  Patient presents with  . Drug Overdose     (Consider location/radiation/quality/duration/timing/severity/associated sxs/prior Treatment) Patient is a 48 y.o. female presenting with Overdose. The history is provided by the patient, the spouse and a relative.  Drug Overdose This is a new problem. Pertinent negatives include no abdominal pain and no shortness of breath.   patient has had some mild depression in the past. She's been having trouble with her teenage daughter. She states that she found some things on the phone that she did not like. She states she feels bad mother. She said she didn't know what to do and she thought if she killed herself it may straighten up her daughter. Last night she took several of her husband's Ambien, some Xanax, and on Prozac in a suicide attempt. She states she woke up sometime during the night it was a pressure is alive and another pill or 2. She was found by her family members today when they got home from work and she was still in bed. She admitted what she had done. She states that she realizes it was attempting to do. She no longer wants to kill himself. She has close friends in the community and has followup for tomorrow or the next day. No previous suicide attempts. No other substance abuse.  Past Medical History  Diagnosis Date  . Hypertension 05/28/2012    Intermittant problem  . Depression    No past surgical history on file. No family history on file. History  Substance Use Topics  . Smoking status: Never Smoker   . Smokeless tobacco: Never Used  . Alcohol Use: No   OB History   Grav Para Term Preterm Abortions TAB SAB Ect Mult Living                 Review of Systems  Constitutional: Negative for fatigue.  Respiratory: Negative for shortness of breath.   Gastrointestinal: Negative for abdominal  pain.  Genitourinary: Negative for dysuria.  Psychiatric/Behavioral: Positive for suicidal ideas. Negative for hallucinations, confusion and sleep disturbance.      Allergies  Review of patient's allergies indicates no known allergies.  Home Medications   Current Outpatient Rx  Name  Route  Sig  Dispense  Refill  . ALPRAZolam (XANAX) 0.5 MG tablet   Oral   Take 0.5 mg by mouth daily as needed for anxiety.          Marland Kitchen FLUoxetine (PROZAC) 10 MG capsule   Oral   Take 10 mg by mouth daily.         . fluticasone (FLONASE) 50 MCG/ACT nasal spray   Each Nare   Place into both nostrils daily.         Marland Kitchen ibuprofen (ADVIL,MOTRIN) 200 MG tablet   Oral   Take 200 mg by mouth as needed.         . Zolpidem Tartrate (AMBIEN PO)   Oral   Take by mouth.          BP 136/73  Pulse 62  Temp(Src) 98.3 F (36.8 C) (Oral)  Resp 20  SpO2 99%  LMP 09/11/2013 Physical Exam  Nursing note and vitals reviewed. Constitutional: She is oriented to person, place, and time. She appears well-developed and well-nourished.  HENT:  Head: Normocephalic and atraumatic.  Eyes: EOM are normal. Pupils  are equal, round, and reactive to light.  Neck: Normal range of motion. Neck supple.  Cardiovascular: Normal rate, regular rhythm and normal heart sounds.   No murmur heard. Pulmonary/Chest: Effort normal and breath sounds normal. No respiratory distress. She has no wheezes. She has no rales.  Abdominal: Soft. Bowel sounds are normal. She exhibits no distension. There is no tenderness. There is no rebound and no guarding.  Musculoskeletal: Normal range of motion.  Neurological: She is alert and oriented to person, place, and time. No cranial nerve deficit.  Mildly sedate, but awake and appropriate.  Skin: Skin is warm and dry.  Psychiatric: Her speech is normal.  Somewhat depressed.    ED Course  Procedures (including critical care time) Labs Review Labs Reviewed  COMPREHENSIVE METABOLIC  PANEL - Abnormal; Notable for the following:    GFR calc non Af Amer 77 (*)    GFR calc Af Amer 89 (*)    All other components within normal limits  SALICYLATE LEVEL - Abnormal; Notable for the following:    Salicylate Lvl <3.5 (*)    All other components within normal limits  URINE RAPID DRUG SCREEN (HOSP PERFORMED) - Abnormal; Notable for the following:    Benzodiazepines POSITIVE (*)    All other components within normal limits  CBC  ETHANOL  ACETAMINOPHEN LEVEL  CBG MONITORING, ED  POC URINE PREG, ED   Imaging Review No results found.   EKG Interpretation None      MDM   Final diagnoses:  Overdose  Suicide attempt  Depression    Patient with overdose and suicide attempt. She states she's doing it to help with her daughter. Had extensive discussions with the patient and her family members. The husband feels it is safe with her home and followup in one to 2 days. She is still depressed, but does not appear to be acute risk to self right now. I think followup tomorrow is reasonable. Patient and the family both state they would return if symptoms worsen. She appears to medically cleared from the overdose. I would expect the worst of the sedation to already have occurred.    Jasper Riling. Alvino Chapel, MD 10/12/13 [id number]

## 2015-06-22 HISTORY — PX: COLONOSCOPY: SHX174

## 2015-07-18 ENCOUNTER — Ambulatory Visit: Payer: Self-pay | Admitting: General Surgery

## 2015-07-18 NOTE — H&P (Signed)
Crystal Davenport 07/18/2015 9:02 AM Location: [facility] Patient #: [id number] DOB: 10-05-1965 Married / Language: [name] / Race: White Female  History of Present Illness [name] MD; 07/18/2015 9:41 AM) The patient is a 50 year old female.   Note:She is referred by Dr. 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 She underwent a screening colonoscopy 06/22/15 which demonstrated the rectal polyp that was partially removed. Her grandmother and great grandmother had colon cancer by her report. No rectal bleeding. Has loose BMs in the morning. Has not had a BM this morning. 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 She has a lot going on today and is somewhat upset because of this.  Other Problems Jeralyn Ruths, [place]; 07/18/2015 9:03 AM) Anxiety Disorder Asthma Back Pain Bladder Problems Hemorrhoids  Past Surgical History Jeralyn Ruths, [place]; 07/18/2015 9:03 AM) Oral Surgery  Diagnostic Studies History Jeralyn Ruths, [place]; 07/18/2015 9:03 AM) Colonoscopy within last year Mammogram within last year Pap Smear 1-5 years ago  Allergies Jeralyn Ruths, [place]; 07/18/2015 9:06 AM) No Known Drug Allergies 07/18/2015  Medication History Jeralyn Ruths, CMA; 07/18/2015 9:07 AM) Plaquenil (200MG  Tablet, Oral daily) Active. VESIcare (5MG  Tablet, Oral daily) Active. Fluorometholone (Ophth) (0.1% Suspension, Ophthalmic as directed) Active. FLUoxetine HCl (PMDD) (10MG  Tablet, Oral daily) Active. Medications Reconciled Xanax (0.5MG  Tablet, Oral prn) Active.  Social History Jeralyn Ruths, [place]; 07/18/2015 9:03 AM) Alcohol use Occasional alcohol use. Caffeine use Coffee, Tea. No drug use Tobacco use Never smoker.  Family History Jeralyn Ruths, [place]; 07/18/2015 9:03 AM) Alcohol Abuse Father. Arthritis Mother. Bleeding disorder Family Members In General. Cerebrovascular  Accident Family Members In [place] Members In General. Depression Mother. Diabetes Mellitus Brother. Prostate Cancer Father.  Pregnancy / Birth History Jeralyn Ruths, [place]; 07/18/2015 9:03 AM) Age at menarche 87 years. Age of menopause 51-55 Contraceptive History Oral contraceptives. Gravida 3 Maternal age 85-30 Para 3     Review of Systems ([place]; 07/18/2015 9:03 AM) General Present- Fatigue. Not Present- Appetite Loss, Chills, Fever, Night Sweats, Weight Gain and Weight Loss. Skin Present- Dryness. Not Present- Change in Wart/Mole, Hives, Jaundice, New Lesions, Non-Healing Wounds, Rash and Ulcer. HEENT Present- Seasonal Allergies and Wears glasses/contact lenses. Not Present- Earache, Hearing Loss, Hoarseness, Nose Bleed, Oral Ulcers, Ringing in the Ears, Sinus Pain, Sore Throat, Visual Disturbances and Yellow Eyes. Respiratory Not Present- Bloody sputum, Chronic Cough, Difficulty Breathing, Snoring and Wheezing. Breast Not Present- Breast Mass, Breast Pain, Nipple Discharge and Skin Changes. Gastrointestinal Present- Chronic diarrhea. Not Present- Abdominal Pain, Bloating, Bloody Stool, Change in Bowel Habits, Constipation, Difficulty Swallowing, Excessive gas, Gets full quickly at meals, Hemorrhoids, Indigestion, Nausea, Rectal Pain and Vomiting. Female Genitourinary Present- Urgency. Not Present- Frequency, Nocturia, Painful Urination and Pelvic Pain. Musculoskeletal Present- Joint Stiffness. Not Present- Back Pain, Joint Pain, Muscle Pain, Muscle Weakness and Swelling of Extremities. Neurological Present- Decreased Memory. Not Present- Fainting, Headaches, Numbness, Seizures, Tingling, Tremor, Trouble walking and Weakness. Endocrine Present- Cold Intolerance. Not Present- Excessive Hunger, Hair Changes, Heat Intolerance, Hot flashes and New Diabetes. Hematology Present- Easy Bruising. Not Present- Excessive bleeding, Gland problems, HIV and  Persistent Infections.  Vitals Jearld Fenton Morris CMA; 07/18/2015 9:08 AM) 07/18/2015 9:07 AM Weight: 131 lb Height: 65in Body Surface Area: 1.65 m Body Mass Index: 21.8 kg/m  Temp.: 98.42F(Oral)  Pulse: 76 (Regular)  BP: 138/88 (Sitting, Left Arm, Standard)      Physical Exam Sherren Mocha  Adalberto Cole MD; 07/18/2015 9:43 AM)  The physical exam findings are as follows: Note:General-well-developed well-nourished female who is teary-eyed at times.  Abdomen-soft, nontender, no lower abdominal masses.  Anorectal-no external lesions or fissures. Normal sphincter tone. Rectum is full of stool. Exam was unable to be done because of stool burden. She tried to go to the bathroom but could not. The DRE was very uncomfortable for her.    Assessment & Plan [name] MD; 07/18/2015 9:44 AM)  ADENOMATOUS RECTAL POLYP (D12.8) Impression: Unable to complete exam today because of discomfort as well as stool burden. We discussed options including coming back for a second office appointment or doing an exam under anesthesia and removing the polyp at that time.  Plan: After further discussion, we have decided to pursue exam under anesthesia and removal of the adenomatous polyp of the rectum. The procedure and the risks were discussed with her. Risks include but are not limited to bleeding, infection, anesthesia, unable to identify the lesion. She seems understand this and would like to proceed.  Jackolyn Confer, MD

## 2015-07-31 ENCOUNTER — Encounter (HOSPITAL_BASED_OUTPATIENT_CLINIC_OR_DEPARTMENT_OTHER): Payer: Self-pay | Admitting: *Deleted

## 2015-08-02 ENCOUNTER — Encounter (HOSPITAL_BASED_OUTPATIENT_CLINIC_OR_DEPARTMENT_OTHER): Payer: Self-pay | Admitting: *Deleted

## 2015-08-02 NOTE — Progress Notes (Signed)
NPO AFTER MN.  ARRIVE AT 1015.  GETTING LAB WORK DONE MON. 08-06-2015.  WILL TAKE PLAQUENIL AND PROZAC AM DOS W/ SIPS OF WATER.

## 2015-08-06 DIAGNOSIS — D128 Benign neoplasm of rectum: Secondary | ICD-10-CM | POA: Diagnosis not present

## 2015-08-06 DIAGNOSIS — F418 Other specified anxiety disorders: Secondary | ICD-10-CM | POA: Diagnosis not present

## 2015-08-06 DIAGNOSIS — F419 Anxiety disorder, unspecified: Secondary | ICD-10-CM | POA: Diagnosis not present

## 2015-08-06 DIAGNOSIS — K621 Rectal polyp: Secondary | ICD-10-CM | POA: Diagnosis present

## 2015-08-06 DIAGNOSIS — Z79899 Other long term (current) drug therapy: Secondary | ICD-10-CM | POA: Diagnosis not present

## 2015-08-06 DIAGNOSIS — J45909 Unspecified asthma, uncomplicated: Secondary | ICD-10-CM | POA: Diagnosis not present

## 2015-08-06 LAB — PROTIME-INR
INR: 1.06 (ref 0.00–1.49)
Prothrombin Time: 14 seconds (ref 11.6–15.2)

## 2015-08-06 LAB — CBC WITH DIFFERENTIAL/PLATELET
BASOS ABS: 0 10*3/uL (ref 0.0–0.1)
BASOS PCT: 0 %
EOS ABS: 0.1 10*3/uL (ref 0.0–0.7)
EOS PCT: 2 %
HCT: 39.3 % (ref 36.0–46.0)
Hemoglobin: 12.8 g/dL (ref 12.0–15.0)
LYMPHS PCT: 21 %
Lymphs Abs: 1.3 10*3/uL (ref 0.7–4.0)
MCH: 30.5 pg (ref 26.0–34.0)
MCHC: 32.6 g/dL (ref 30.0–36.0)
MCV: 93.6 fL (ref 78.0–100.0)
Monocytes Absolute: 0.3 10*3/uL (ref 0.1–1.0)
Monocytes Relative: 4 %
Neutro Abs: 4.5 10*3/uL (ref 1.7–7.7)
Neutrophils Relative %: 73 %
PLATELETS: 194 10*3/uL (ref 150–400)
RBC: 4.2 MIL/uL (ref 3.87–5.11)
RDW: 12.9 % (ref 11.5–15.5)
WBC: 6.1 10*3/uL (ref 4.0–10.5)

## 2015-08-06 LAB — COMPREHENSIVE METABOLIC PANEL
ALBUMIN: 4.4 g/dL (ref 3.5–5.0)
ALT: 11 U/L — AB (ref 14–54)
AST: 17 U/L (ref 15–41)
Alkaline Phosphatase: 41 U/L (ref 38–126)
Anion gap: 9 (ref 5–15)
BUN: 15 mg/dL (ref 6–20)
CHLORIDE: 104 mmol/L (ref 101–111)
CO2: 28 mmol/L (ref 22–32)
CREATININE: 0.94 mg/dL (ref 0.44–1.00)
Calcium: 9.7 mg/dL (ref 8.9–10.3)
GFR calc Af Amer: >60 mL/min (ref >60)
GFR calc non Af Amer: >60 mL/min (ref >60)
GLUCOSE: 92 mg/dL (ref 65–99)
POTASSIUM: 4.1 mmol/L (ref 3.5–5.1)
SODIUM: 141 mmol/L (ref 135–145)
Total Bilirubin: 0.5 mg/dL (ref 0.3–1.2)
Total Protein: 7.4 g/dL (ref 6.5–8.1)

## 2015-08-07 ENCOUNTER — Ambulatory Visit (HOSPITAL_BASED_OUTPATIENT_CLINIC_OR_DEPARTMENT_OTHER)
Admission: RE | Admit: 2015-08-07 | Discharge: 2015-08-07 | Disposition: A | Discharge status: Home / Self Care | Payer: BLUE CROSS/BLUE SHIELD | Source: Ambulatory Visit | Attending: General Surgery | Admitting: General Surgery

## 2015-08-07 ENCOUNTER — Ambulatory Visit (HOSPITAL_BASED_OUTPATIENT_CLINIC_OR_DEPARTMENT_OTHER): Payer: BLUE CROSS/BLUE SHIELD | Admitting: Anesthesiology

## 2015-08-07 ENCOUNTER — Encounter (HOSPITAL_BASED_OUTPATIENT_CLINIC_OR_DEPARTMENT_OTHER): Payer: Self-pay | Admitting: *Deleted

## 2015-08-07 ENCOUNTER — Encounter (HOSPITAL_BASED_OUTPATIENT_CLINIC_OR_DEPARTMENT_OTHER)
Admission: RE | Disposition: A | Discharge status: Home / Self Care | Payer: Self-pay | Source: Ambulatory Visit | Attending: General Surgery

## 2015-08-07 DIAGNOSIS — F418 Other specified anxiety disorders: Secondary | ICD-10-CM | POA: Insufficient documentation

## 2015-08-07 DIAGNOSIS — Z79899 Other long term (current) drug therapy: Secondary | ICD-10-CM | POA: Insufficient documentation

## 2015-08-07 DIAGNOSIS — J45909 Unspecified asthma, uncomplicated: Secondary | ICD-10-CM | POA: Insufficient documentation

## 2015-08-07 DIAGNOSIS — D128 Benign neoplasm of rectum: Secondary | ICD-10-CM | POA: Diagnosis not present

## 2015-08-07 DIAGNOSIS — F419 Anxiety disorder, unspecified: Secondary | ICD-10-CM | POA: Insufficient documentation

## 2015-08-07 HISTORY — DX: Dry eye syndrome of unspecified lacrimal gland: H04.129

## 2015-08-07 HISTORY — DX: Pain in unspecified joint: M25.50

## 2015-08-07 HISTORY — DX: Benign neoplasm of rectum: D12.8

## 2015-08-07 HISTORY — DX: Other overlap syndromes: M35.1

## 2015-08-07 HISTORY — DX: Urge incontinence: N39.41

## 2015-08-07 HISTORY — DX: Anxiety disorder, unspecified: F41.9

## 2015-08-07 HISTORY — DX: Other chronic pain: G89.29

## 2015-08-07 SURGERY — EXAM UNDER ANESTHESIA
Anesthesia: General | Site: Rectum

## 2015-08-07 MED ORDER — FENTANYL CITRATE (PF) 100 MCG/2ML IJ SOLN
INTRAMUSCULAR | Status: DC | PRN
  Administered 2015-08-07 (×2): 50 ug via INTRAVENOUS

## 2015-08-07 MED ORDER — LIDOCAINE HCL (CARDIAC) 20 MG/ML IV SOLN
INTRAVENOUS | Status: AC
  Filled 2015-08-07: qty 5

## 2015-08-07 MED ORDER — LACTATED RINGERS IV SOLN
INTRAVENOUS | Status: DC
  Administered 2015-08-07: 11:00:00 via INTRAVENOUS
  Filled 2015-08-07: qty 1000

## 2015-08-07 MED ORDER — ONDANSETRON HCL 4 MG/2ML IJ SOLN
INTRAMUSCULAR | Status: DC | PRN
  Administered 2015-08-07: 4 mg via INTRAVENOUS

## 2015-08-07 MED ORDER — OXYCODONE-ACETAMINOPHEN 5-325 MG PO TABS: 1.0000 | ORAL_TABLET | ORAL | Status: DC | PRN

## 2015-08-07 MED ORDER — GLYCOPYRROLATE 0.2 MG/ML IJ SOLN
INTRAMUSCULAR | Status: AC
  Filled 2015-08-07: qty 1

## 2015-08-07 MED ORDER — DEXAMETHASONE SODIUM PHOSPHATE 10 MG/ML IJ SOLN
INTRAMUSCULAR | Status: AC
  Filled 2015-08-07: qty 1

## 2015-08-07 MED ORDER — GLYCOPYRROLATE 0.2 MG/ML IJ SOLN
INTRAMUSCULAR | Status: DC | PRN
  Administered 2015-08-07: 0.2 mg via INTRAVENOUS

## 2015-08-07 MED ORDER — BUPIVACAINE LIPOSOME 1.3 % IJ SUSP
INTRAMUSCULAR | Status: DC | PRN
  Administered 2015-08-07: 10 mL

## 2015-08-07 MED ORDER — FLEET ENEMA 7-19 GM/118ML RE ENEM
1.0000 | ENEMA | Freq: Once | RECTAL | Status: DC
  Filled 2015-08-07: qty 1

## 2015-08-07 MED ORDER — LIDOCAINE HCL (CARDIAC) 20 MG/ML IV SOLN
INTRAVENOUS | Status: DC | PRN
  Administered 2015-08-07: 60 mg via INTRAVENOUS

## 2015-08-07 MED ORDER — MIDAZOLAM HCL 2 MG/2ML IJ SOLN
INTRAMUSCULAR | Status: AC
  Filled 2015-08-07: qty 2

## 2015-08-07 MED ORDER — DEXAMETHASONE SODIUM PHOSPHATE 4 MG/ML IJ SOLN
INTRAMUSCULAR | Status: DC | PRN
  Administered 2015-08-07: 10 mg via INTRAVENOUS

## 2015-08-07 MED ORDER — CEFOTETAN DISODIUM-DEXTROSE 2-2.08 GM-% IV SOLR
INTRAVENOUS | Status: AC
  Filled 2015-08-07: qty 50

## 2015-08-07 MED ORDER — DEXTROSE 5 % IV SOLN
2.0000 g | INTRAVENOUS | Status: AC
  Administered 2015-08-07: 2 g via INTRAVENOUS
  Filled 2015-08-07: qty 2

## 2015-08-07 MED ORDER — MIDAZOLAM HCL 5 MG/5ML IJ SOLN
INTRAMUSCULAR | Status: DC | PRN
  Administered 2015-08-07: 2 mg via INTRAVENOUS

## 2015-08-07 MED ORDER — FENTANYL CITRATE (PF) 100 MCG/2ML IJ SOLN
INTRAMUSCULAR | Status: AC
  Filled 2015-08-07: qty 2

## 2015-08-07 MED ORDER — HEMOSTATIC AGENTS (NO CHARGE) OPTIME
TOPICAL | Status: DC | PRN
  Administered 2015-08-07: 1 via TOPICAL

## 2015-08-07 MED ORDER — ONDANSETRON HCL 4 MG/2ML IJ SOLN
INTRAMUSCULAR | Status: AC
Start: 2015-08-07 — End: 2015-08-07
  Filled 2015-08-07: qty 2

## 2015-08-07 MED ORDER — PROPOFOL 10 MG/ML IV BOLUS
INTRAVENOUS | Status: DC | PRN
  Administered 2015-08-07: 180 mg via INTRAVENOUS

## 2015-08-07 SURGICAL SUPPLY — 59 items
APL SKNCLS STERI-STRIP NONHPOA (GAUZE/BANDAGES/DRESSINGS)
BENZOIN TINCTURE PRP APPL 2/3 (GAUZE/BANDAGES/DRESSINGS) IMPLANT
BLADE CLIPPER SURG (BLADE) IMPLANT
BLADE SURG 15 STRL LF DISP TIS (BLADE) IMPLANT
BLADE SURG 15 STRL SS (BLADE)
BRIEF STRETCH FOR OB PAD LRG (UNDERPADS AND DIAPERS) IMPLANT
CANISTER SUCTION 1200CC (MISCELLANEOUS) IMPLANT
CANISTER SUCTION 2500CC (MISCELLANEOUS) IMPLANT
CATH ROBINSON RED A/P 16FR (CATHETERS) IMPLANT
CLEANER CAUTERY TIP 5X5 PAD (MISCELLANEOUS) IMPLANT
COVER BACK TABLE 60X90IN (DRAPES) IMPLANT
DRAIN PENROSE 18X1/4 LTX STRL (WOUND CARE) IMPLANT
DRAPE LG THREE QUARTER DISP (DRAPES) IMPLANT
DRAPE UNDERBUTTOCKS STRL (DRAPE) IMPLANT
DRSG PAD ABDOMINAL 8X10 ST (GAUZE/BANDAGES/DRESSINGS) ×2 IMPLANT
ELECT REM PT RETURN 9FT ADLT (ELECTROSURGICAL)
ELECTRODE REM PT RTRN 9FT ADLT (ELECTROSURGICAL) IMPLANT
GAUZE PACKING IODOFORM 1/2 (PACKING) IMPLANT
GAUZE PACKING IODOFORM 2 (PACKING) IMPLANT
GAUZE SPONGE 4X4 12PLY STRL LF (GAUZE/BANDAGES/DRESSINGS) IMPLANT
GLOVE BIO SURGEON STRL SZ 6.5 (GLOVE) ×1 IMPLANT
GLOVE BIO SURGEON STRL SZ8 (GLOVE) ×3 IMPLANT
GLOVE BIO SURGEONS STRL SZ 6.5 (GLOVE) ×1
GLOVE BIOGEL PI IND STRL 6.5 (GLOVE) IMPLANT
GLOVE BIOGEL PI IND STRL 7.5 (GLOVE) IMPLANT
GLOVE BIOGEL PI IND STRL 8 (GLOVE) ×1 IMPLANT
GLOVE BIOGEL PI INDICATOR 6.5 (GLOVE) ×4
GLOVE BIOGEL PI INDICATOR 7.5 (GLOVE) ×2
GLOVE BIOGEL PI INDICATOR 8 (GLOVE) ×2
GLOVE SURG SS PI 6.5 STRL IVOR (GLOVE) ×2 IMPLANT
GOWN STRL REUS W/ TWL LRG LVL3 (GOWN DISPOSABLE) ×1 IMPLANT
GOWN STRL REUS W/ TWL XL LVL3 (GOWN DISPOSABLE) IMPLANT
GOWN STRL REUS W/TWL LRG LVL3 (GOWN DISPOSABLE) ×3
GOWN STRL REUS W/TWL XL LVL3 (GOWN DISPOSABLE)
KIT ROOM TURNOVER WOR (KITS) ×3 IMPLANT
LEGGING LITHOTOMY PAIR STRL (DRAPES) IMPLANT
MANIFOLD NEPTUNE II (INSTRUMENTS) IMPLANT
NEEDLE HYPO 22GX1.5 SAFETY (NEEDLE) IMPLANT
PACK BASIN DAY SURGERY FS (CUSTOM PROCEDURE TRAY) IMPLANT
PAD ABD 8X10 STRL (GAUZE/BANDAGES/DRESSINGS) IMPLANT
PAD CLEANER CAUTERY TIP 5X5 (MISCELLANEOUS)
PAD PREP 24X48 CUFFED NSTRL (MISCELLANEOUS) IMPLANT
PENCIL BUTTON HOLSTER BLD 10FT (ELECTRODE) IMPLANT
SHEARS HARMONIC 9CM CVD (BLADE) ×2 IMPLANT
SPONGE GAUZE 4X4 12PLY STER LF (GAUZE/BANDAGES/DRESSINGS) ×2 IMPLANT
SPONGE HEMORRHOID 8X3CM (HEMOSTASIS) IMPLANT
SURGILUBE 2OZ TUBE FLIPTOP (MISCELLANEOUS) ×3 IMPLANT
SUT CHROMIC 2 0 SH (SUTURE) IMPLANT
SUT CHROMIC 3 0 SH 27 (SUTURE) IMPLANT
SUT SILK 0 TIES 10X30 (SUTURE) IMPLANT
SYR CONTROL 10ML LL (SYRINGE) IMPLANT
TOWEL OR 17X24 6PK STRL BLUE (TOWEL DISPOSABLE) IMPLANT
TRAY DSU PREP LF (CUSTOM PROCEDURE TRAY) ×2 IMPLANT
TUBE CONNECTING 12'X1/4 (SUCTIONS)
TUBE CONNECTING 12X1/4 (SUCTIONS) IMPLANT
UNDERPAD 30X30 INCONTINENT (UNDERPADS AND DIAPERS) ×1 IMPLANT
VACUUM HOSE 7/8X10 W/ WAND (MISCELLANEOUS) IMPLANT
WATER STERILE IRR 500ML POUR (IV SOLUTION) ×3 IMPLANT
YANKAUER SUCT BULB TIP NO VENT (SUCTIONS) IMPLANT

## 2015-08-07 NOTE — H&P (View-Only) (Signed)
Crystal Davenport 07/18/2015 9:02 AM Location: [facility] Patient #: [id number] DOB: October 27, 1965 Married / Language: [name] / Race: White Female  History of Present Illness [name] MD; 07/18/2015 9:41 AM) The patient is a 50 year old female.   Note:She is referred by Dr. 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 She underwent a screening colonoscopy 06/22/15 which demonstrated the rectal polyp that was partially removed. Her grandmother and great grandmother had colon cancer by her report. No rectal bleeding. Has loose BMs in the morning. Has not had a BM this morning. 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 She has a lot going on today and is somewhat upset because of this.  Other Problems Jeralyn Ruths, [place]; 07/18/2015 9:03 AM) Anxiety Disorder Asthma Back Pain Bladder Problems Hemorrhoids  Past Surgical History Jeralyn Ruths, [place]; 07/18/2015 9:03 AM) Oral Surgery  Diagnostic Studies History Jeralyn Ruths, [place]; 07/18/2015 9:03 AM) Colonoscopy within last year Mammogram within last year Pap Smear 1-5 years ago  Allergies Jeralyn Ruths, [place]; 07/18/2015 9:06 AM) No Known Drug Allergies 07/18/2015  Medication History Jeralyn Ruths, CMA; 07/18/2015 9:07 AM) Plaquenil (200MG  Tablet, Oral daily) Active. VESIcare (5MG  Tablet, Oral daily) Active. Fluorometholone (Ophth) (0.1% Suspension, Ophthalmic as directed) Active. FLUoxetine HCl (PMDD) (10MG  Tablet, Oral daily) Active. Medications Reconciled Xanax (0.5MG  Tablet, Oral prn) Active.  Social History Jeralyn Ruths, [place]; 07/18/2015 9:03 AM) Alcohol use Occasional alcohol use. Caffeine use Coffee, Tea. No drug use Tobacco use Never smoker.  Family History Jeralyn Ruths, [place]; 07/18/2015 9:03 AM) Alcohol Abuse Father. Arthritis Mother. Bleeding disorder Family Members In General. Cerebrovascular  Accident Family Members In [place] Members In General. Depression Mother. Diabetes Mellitus Brother. Prostate Cancer Father.  Pregnancy / Birth History Jeralyn Ruths, [place]; 07/18/2015 9:03 AM) Age at menarche 40 years. Age of menopause 51-55 Contraceptive History Oral contraceptives. Gravida 3 Maternal age 61-30 Para 3     Review of Systems ([place]; 07/18/2015 9:03 AM) General Present- Fatigue. Not Present- Appetite Loss, Chills, Fever, Night Sweats, Weight Gain and Weight Loss. Skin Present- Dryness. Not Present- Change in Wart/Mole, Hives, Jaundice, New Lesions, Non-Healing Wounds, Rash and Ulcer. HEENT Present- Seasonal Allergies and Wears glasses/contact lenses. Not Present- Earache, Hearing Loss, Hoarseness, Nose Bleed, Oral Ulcers, Ringing in the Ears, Sinus Pain, Sore Throat, Visual Disturbances and Yellow Eyes. Respiratory Not Present- Bloody sputum, Chronic Cough, Difficulty Breathing, Snoring and Wheezing. Breast Not Present- Breast Mass, Breast Pain, Nipple Discharge and Skin Changes. Gastrointestinal Present- Chronic diarrhea. Not Present- Abdominal Pain, Bloating, Bloody Stool, Change in Bowel Habits, Constipation, Difficulty Swallowing, Excessive gas, Gets full quickly at meals, Hemorrhoids, Indigestion, Nausea, Rectal Pain and Vomiting. Female Genitourinary Present- Urgency. Not Present- Frequency, Nocturia, Painful Urination and Pelvic Pain. Musculoskeletal Present- Joint Stiffness. Not Present- Back Pain, Joint Pain, Muscle Pain, Muscle Weakness and Swelling of Extremities. Neurological Present- Decreased Memory. Not Present- Fainting, Headaches, Numbness, Seizures, Tingling, Tremor, Trouble walking and Weakness. Endocrine Present- Cold Intolerance. Not Present- Excessive Hunger, Hair Changes, Heat Intolerance, Hot flashes and New Diabetes. Hematology Present- Easy Bruising. Not Present- Excessive bleeding, Gland problems, HIV and  Persistent Infections.  Vitals Jearld Fenton Morris CMA; 07/18/2015 9:08 AM) 07/18/2015 9:07 AM Weight: 131 lb Height: 65in Body Surface Area: 1.65 m Body Mass Index: 21.8 kg/m  Temp.: 98.51F(Oral)  Pulse: 76 (Regular)  BP: 138/88 (Sitting, Left Arm, Standard)      Physical Exam Sherren Mocha  Adalberto Cole MD; 07/18/2015 9:43 AM)  The physical exam findings are as follows: Note:General-well-developed well-nourished female who is teary-eyed at times.  Abdomen-soft, nontender, no lower abdominal masses.  Anorectal-no external lesions or fissures. Normal sphincter tone. Rectum is full of stool. Exam was unable to be done because of stool burden. She tried to go to the bathroom but could not. The DRE was very uncomfortable for her.    Assessment & Plan [name] MD; 07/18/2015 9:44 AM)  ADENOMATOUS RECTAL POLYP (D12.8) Impression: Unable to complete exam today because of discomfort as well as stool burden. We discussed options including coming back for a second office appointment or doing an exam under anesthesia and removing the polyp at that time.  Plan: After further discussion, we have decided to pursue exam under anesthesia and removal of the adenomatous polyp of the rectum. The procedure and the risks were discussed with her. Risks include but are not limited to bleeding, infection, anesthesia, unable to identify the lesion. She seems understand this and would like to proceed.  Jackolyn Confer, MD

## 2015-08-07 NOTE — Op Note (Signed)
Operative Note  Crystal Davenport female 50 y.o. 08/07/2015  PREOPERATIVE DX:  Serrated, adenomatous rectal polyp  POSTOPERATIVE DX:  Same  PROCEDURE:   Examination under anesthesia. Transanal excision of serrated,adenomatous polyp (5:00 position to 8:00 position).         Surgeon: [name]   Assistants: none  Anesthesia: General LMA anesthesia  Indications:   This is a 50 year old female who underwent a screening colonoscopy back in December. She was found to have  Serrated adenomatous polyp  Involving the 6:00 to 8:00 area approximately. This area was not able to be completely removed. She now presents for transanal excision.    Procedure Detail:  She was seen in the holding area. She is brought to the operating room placed supine on the operating table and general anesthetic was given. She was placed in the lithotomy position. The perianal area was sterilely prepped and draped. A timeout was performed.  Digital rectal exam demonstrated a palpable irregular area posteriorly. Gentle anal dilatation was performed. An anal retractor was placed into the anal canal and in the distal rectum a sessile road-based polypoid lesion was noted. Using the Harmonic scalpel, this was excised. Some areas around the edges of excision were suspicious for remaining polyp tissue and these were removed and sent along with the main specimen as well. All specimens were sent to pathology.  The rest of the anal canal was carefully inspected and no suspicious lesions were noted. The area. Excision was inspected and it was hemostatic. I then performed an anal block with Exparel.  A piece of Gelfoam was then placed into the anorectal.  A dry dressing was applied.  She tolerated the procedure well without any apparent complications. She's up was taken to the recovery room in satisfactory condition.  Estimated Blood Loss:  less than 100 mL          Specimens:  Adenomatous rectal polyp tissue

## 2015-08-07 NOTE — Discharge Instructions (Addendum)
[facility] _______[facility]  RECTAL SURGERY POST OP INSTRUCTIONS: POST OP INSTRUCTIONS  Always review your discharge instruction sheet given to you by the facility where your surgery was performed. IF YOU HAVE DISABILITY OR FAMILY LEAVE FORMS, YOU MUST BRING THEM TO THE OFFICE FOR PROCESSING.   DO NOT GIVE THEM TO YOUR DOCTOR.  1. A  prescription for pain medication may be given to you upon discharge.  Take your pain medication as prescribed, if needed.  If narcotic pain medicine is not needed, then you may take acetaminophen (Tylenol) or ibuprofen (Advil) as needed. 2. Take your usually prescribed medications unless otherwise directed. 3. If you need a refill on your pain medication, please contact your pharmacy.  They will contact our office to request authorization. Prescriptions will not be filled after 5 pm or on week-ends. 4. You should follow a liquid diet the first 48 hours after arrival home.  Be sure to include lots of fluids daily.  After 48 hours, begin a soft diet for 5 days. After that, begin a high-fiber diet. 5. Most patients will experience some swelling and discomfort in the rectal area. Ice packs, reclining and warm tub soaks will help.  Swelling and discomfort can take several days to resolve.  6. It is common to experience some constipation if taking pain medication after surgery.  Increasing fluid intake and taking a stool softener (such as Colace) will usually help or prevent this problem from occurring.  A mild laxative (Milk of Magnesia or Miralax) should be taken according to package directions if there are no bowel movements after 48 hours. 7. Unless discharge instructions indicate otherwise, leave your bandage dry and in place for 24 hours, or remove the bandage if you have a bowel movement. You may notice a small amount of bleeding with bowel movements for the first few days. You may have some packing in the rectum which will come out over the first day or two.  You will need to wear an absorbent pad or soft cotton gauze in your underwear until the drainage stops.it. 8. ACTIVITIES:  You may resume light daily activities beginning the next day--such as daily self-care, walking, climbing stairs--gradually increasing activities as tolerated.  You may have sexual intercourse when it is comfortable.  Refrain from any heavy lifting or straining for one week. a. You may drive when you are no longer taking prescription pain medication, you can comfortably wear a seatbelt, and you can safely maneuver your car and apply brakes. b. RETURN TO WORK: : _Light work within 3-4 days if comfortable, full duty work in 1 week if comfortable.___________________ c.  9. You should see your doctor in the office for a follow-up appointment approximately 2-3 weeks after your surgery.  Make sure that you call for this appointment within a day or two after you arrive home to insure a convenient appointment time. 10. OTHER INSTRUCTIONS:  __________________________________________________________________________________________________________________________________________________________________________________________  WHEN TO CALL YOUR DOCTOR: 1. Fever over 101.0 2. Inability to urinate 3. Nausea and/or vomiting 4. Extreme swelling or bruising 5. Continued bleeding from rectum. 6. Increased pain, redness, or drainage from the incision 7. Constipation  The clinic staff is available to answer your questions during regular business hours.  Please dont hesitate to call and ask to speak to one of the nurses for clinical concerns.  If you have a medical emergency, go to the nearest emergency room or call 911.  A surgeon from [facility] is always on call at the  hospital   [address] [place], [place], [place]  [address] ?  [address], [place], [place]   [address] [phone] ? [phone] ? FAX [phone] Web site:  [email or web address] Information for Discharge Teaching: EXPAREL (bupivacaine liposome injectable suspension)   Your surgeon gave you EXPAREL(bupivacaine) in your surgical incision to help control your pain after surgery.   EXPAREL is a local anesthetic that provides pain relief by numbing the tissue around the surgical site.  EXPAREL is designed to release pain medication over time and can control pain for up to 72 hours.  Depending on how you respond to EXPAREL, you may require less pain medication during your recovery.  Possible side effects:  Temporary loss of sensation or ability to move in the area where bupivacaine was injected.  Nausea, vomiting, constipation  Rarely, numbness and tingling in your mouth or lips, lightheadedness, or anxiety may occur.  Call your doctor right away if you think you may be experiencing any of these sensations, or if you have other questions regarding possible side effects.  Follow all other discharge instructions given to you by your surgeon or nurse. Eat a healthy diet and drink plenty of water or other fluids.  If you return to the hospital for any reason within 96 hours following the administration of EXPAREL, please inform your health care providers. Post Anesthesia Home Care Instructions  Activity: Get plenty of rest for the remainder of the day. A responsible adult should stay with you for 24 hours following the procedure.  For the next 24 hours, DO NOT: -Drive a car -Paediatric nurse -Drink alcoholic beverages -Take any medication unless instructed by your physician -Make any legal decisions or sign important papers.  Meals: Start with liquid foods such as gelatin or soup. Progress to regular foods as tolerated. Avoid greasy, spicy, heavy foods. If nausea and/or vomiting occur, drink only clear liquids until the nausea and/or vomiting subsides. Call your physician if vomiting continues.  Special  Instructions/Symptoms: Your throat may feel dry or sore from the anesthesia or the breathing tube placed in your throat during surgery. If this causes discomfort, gargle with warm salt water. The discomfort should disappear within 24 hours.  If you had a scopolamine patch placed behind your ear for the management of post- operative nausea and/or vomiting:  1. The medication in the patch is effective for 72 hours, after which it should be removed.  Wrap patch in a tissue and discard in the trash. Wash hands thoroughly with soap and water. 2. You may remove the patch earlier than 72 hours if you experience unpleasant side effects which may include dry mouth, dizziness or visual disturbances. 3. Avoid touching the patch. Wash your hands with soap and water after contact with the patch.

## 2015-08-07 NOTE — Anesthesia Procedure Notes (Signed)
Procedure Name: LMA Insertion Date/Time: 08/07/2015 12:02 PM Performed by: Denna Haggard D Pre-anesthesia Checklist: Patient identified, Emergency Drugs available, Suction available and Patient being monitored Patient Re-evaluated:Patient Re-evaluated prior to inductionOxygen Delivery Method: Circle System Utilized Preoxygenation: Pre-oxygenation with 100% oxygen Intubation Type: IV induction Ventilation: Mask ventilation without difficulty LMA: LMA inserted LMA Size: 4.0 Number of attempts: 1 Airway Equipment and Method: Bite block Placement Confirmation: positive ETCO2 Tube secured with: Tape Dental Injury: Teeth and Oropharynx as per pre-operative assessment

## 2015-08-07 NOTE — Transfer of Care (Signed)
Immediate Anesthesia Transfer of Care Note  Patient: [name]  Procedure(s) Performed: Procedure(s) (LRB): EXAM UNDER ANESTHESIA TRANSANAL EXCISION OF ADENOMATOUS POLYP (N/A)  Patient Location: PACU  Anesthesia Type: General  Level of Consciousness: awake, oriented, sedated and patient cooperative  Airway & Oxygen Therapy: Patient Spontanous Breathing and Patient connected to face mask oxygen  Post-op Assessment: Report given to PACU RN and Post -op Vital signs reviewed and stable  Post vital signs: Reviewed and stable  Complications: No apparent anesthesia complicationsImmediate Anesthesia Transfer of Care Note  Patient: [name]  Procedure(s) Performed: Procedure(s) (LRB): EXAM UNDER ANESTHESIA TRANSANAL EXCISION OF ADENOMATOUS POLYP (N/A)  Patient Location: PACU  Anesthesia Type: General  Level of Consciousness: awake, oriented, sedated and patient cooperative  Airway & Oxygen Therapy: Patient Spontanous Breathing and Patient connected to face mask oxygen  Post-op Assessment: Report given to PACU RN and Post -op Vital signs reviewed and stable  Post vital signs: Reviewed and stable  Complications: No apparent anesthesia complications

## 2015-08-07 NOTE — Anesthesia Preprocedure Evaluation (Signed)
Anesthesia Evaluation  Patient identified by MRN, date of birth, ID band Patient awake    Reviewed: Allergy & Precautions, NPO status , Patient's Chart, lab work & pertinent test results  History of Anesthesia Complications Negative for: history of anesthetic complications  Airway Mallampati: II  TM Distance: >3 FB Neck ROM: Full    Dental no notable dental hx. (+) Dental Advisory Given   Pulmonary neg pulmonary ROS,    Pulmonary exam normal breath sounds clear to auscultation       Cardiovascular negative cardio ROS Normal cardiovascular exam Rhythm:Regular Rate:Normal     Neuro/Psych PSYCHIATRIC DISORDERS Anxiety Depression negative neurological ROS     GI/Hepatic negative GI ROS, Neg liver ROS,   Endo/Other  negative endocrine ROS  Renal/GU negative Renal ROS  negative genitourinary   Musculoskeletal negative musculoskeletal ROS (+)   Abdominal   Peds negative pediatric ROS (+)  Hematology negative hematology ROS (+)   Anesthesia Other Findings   Reproductive/Obstetrics negative OB ROS                             Anesthesia Physical Anesthesia Plan  ASA: II  Anesthesia Plan: General   Post-op Pain Management:    Induction: Intravenous  Airway Management Planned: LMA  Additional Equipment:   Intra-op Plan:   Post-operative Plan: Extubation in OR  Informed Consent: I have reviewed the patients History and Physical, chart, labs and discussed the procedure including the risks, benefits and alternatives for the proposed anesthesia with the patient or authorized representative who has indicated his/her understanding and acceptance.   Dental advisory given  Plan Discussed with: CRNA  Anesthesia Plan Comments:         Anesthesia Quick Evaluation

## 2015-08-07 NOTE — Interval H&P Note (Signed)
History and Physical Interval Note:  08/07/2015 11:40 AM  Crystal Davenport  has presented today for surgery, with the diagnosis of Serrated adenoma of rectum   The various methods of treatment have been discussed with the patient and family. After consideration of risks, benefits and other options for treatment, the patient has consented to  Procedure(s): EXAM UNDER ANESTHESIA REMOVAL OF RECTAL POLYP  (N/A) as a surgical intervention .  The patient's history has been reviewed, patient examined, no change in status, stable for surgery.  I have reviewed the patient's chart and labs.  Questions were answered to the patient's satisfaction.     Shantaya Bluestone Lenna Sciara

## 2015-08-10 NOTE — Anesthesia Postprocedure Evaluation (Signed)
Anesthesia Post Note  Patient: Crystal Davenport  Procedure(s) Performed: Procedure(s) (LRB): EXAM UNDER ANESTHESIA TRANSANAL EXCISION OF ADENOMATOUS POLYP (N/A)  Anesthesia Type: General Level of consciousness: awake and alert Pain management: pain level controlled Vital Signs Assessment: post-procedure vital signs reviewed and stable Respiratory status: spontaneous breathing, nonlabored ventilation, respiratory function stable and patient connected to nasal cannula oxygen Cardiovascular status: blood pressure returned to baseline and stable Postop Assessment: no signs of nausea or vomiting Anesthetic complications: no    Last Vitals:  Filed Vitals:   08/07/15 1430 08/07/15 1453  BP: 135/79 157/89  Pulse: 72 62  Temp:  37 C  Resp: 17 16    Last Pain:  Filed Vitals:   08/07/15 1527  PainSc: 2                  Mekaila Tarnow JENNETTE

## 2015-10-16 DIAGNOSIS — M359 Systemic involvement of connective tissue, unspecified: Secondary | ICD-10-CM | POA: Diagnosis not present

## 2015-10-16 DIAGNOSIS — M2550 Pain in unspecified joint: Secondary | ICD-10-CM | POA: Diagnosis not present

## 2015-10-16 DIAGNOSIS — M3500 Sicca syndrome, unspecified: Secondary | ICD-10-CM | POA: Diagnosis not present

## 2015-10-16 DIAGNOSIS — R5382 Chronic fatigue, unspecified: Secondary | ICD-10-CM | POA: Diagnosis not present

## 2015-10-16 DIAGNOSIS — I7300 Raynaud's syndrome without gangrene: Secondary | ICD-10-CM | POA: Diagnosis not present

## 2015-11-01 DIAGNOSIS — H04123 Dry eye syndrome of bilateral lacrimal glands: Secondary | ICD-10-CM | POA: Diagnosis not present

## 2015-11-01 DIAGNOSIS — H01003 Unspecified blepharitis right eye, unspecified eyelid: Secondary | ICD-10-CM | POA: Diagnosis not present

## 2015-11-01 DIAGNOSIS — M3501 Sicca syndrome with keratoconjunctivitis: Secondary | ICD-10-CM | POA: Diagnosis not present

## 2015-11-01 DIAGNOSIS — H0289 Other specified disorders of eyelid: Secondary | ICD-10-CM | POA: Diagnosis not present

## 2015-11-02 DIAGNOSIS — L738 Other specified follicular disorders: Secondary | ICD-10-CM | POA: Diagnosis not present

## 2015-11-02 DIAGNOSIS — L245 Irritant contact dermatitis due to other chemical products: Secondary | ICD-10-CM | POA: Diagnosis not present

## 2015-11-21 DIAGNOSIS — F411 Generalized anxiety disorder: Secondary | ICD-10-CM | POA: Diagnosis not present

## 2015-12-04 DIAGNOSIS — F411 Generalized anxiety disorder: Secondary | ICD-10-CM | POA: Diagnosis not present

## 2015-12-07 DIAGNOSIS — I10 Essential (primary) hypertension: Secondary | ICD-10-CM | POA: Diagnosis not present

## 2015-12-26 DIAGNOSIS — F411 Generalized anxiety disorder: Secondary | ICD-10-CM | POA: Diagnosis not present

## 2016-01-01 DIAGNOSIS — I10 Essential (primary) hypertension: Secondary | ICD-10-CM | POA: Diagnosis not present

## 2016-01-01 DIAGNOSIS — F419 Anxiety disorder, unspecified: Secondary | ICD-10-CM | POA: Diagnosis not present

## 2016-01-01 DIAGNOSIS — L219 Seborrheic dermatitis, unspecified: Secondary | ICD-10-CM | POA: Diagnosis not present

## 2016-01-01 DIAGNOSIS — M359 Systemic involvement of connective tissue, unspecified: Secondary | ICD-10-CM | POA: Diagnosis not present

## 2016-02-26 DIAGNOSIS — M359 Systemic involvement of connective tissue, unspecified: Secondary | ICD-10-CM | POA: Diagnosis not present

## 2016-02-26 DIAGNOSIS — M2550 Pain in unspecified joint: Secondary | ICD-10-CM | POA: Diagnosis not present

## 2016-02-26 DIAGNOSIS — R5382 Chronic fatigue, unspecified: Secondary | ICD-10-CM | POA: Diagnosis not present

## 2016-02-26 DIAGNOSIS — M3500 Sicca syndrome, unspecified: Secondary | ICD-10-CM | POA: Diagnosis not present

## 2016-05-01 DIAGNOSIS — H04123 Dry eye syndrome of bilateral lacrimal glands: Secondary | ICD-10-CM | POA: Diagnosis not present

## 2016-05-01 DIAGNOSIS — H40013 Open angle with borderline findings, low risk, bilateral: Secondary | ICD-10-CM | POA: Diagnosis not present

## 2016-05-01 DIAGNOSIS — M3501 Sicca syndrome with keratoconjunctivitis: Secondary | ICD-10-CM | POA: Diagnosis not present

## 2016-05-01 DIAGNOSIS — H2513 Age-related nuclear cataract, bilateral: Secondary | ICD-10-CM | POA: Diagnosis not present

## 2016-05-01 DIAGNOSIS — Z79899 Other long term (current) drug therapy: Secondary | ICD-10-CM | POA: Diagnosis not present

## 2016-05-01 DIAGNOSIS — M351 Other overlap syndromes: Secondary | ICD-10-CM | POA: Diagnosis not present

## 2016-05-05 DIAGNOSIS — H40013 Open angle with borderline findings, low risk, bilateral: Secondary | ICD-10-CM | POA: Diagnosis not present

## 2016-06-02 DIAGNOSIS — H00022 Hordeolum internum right lower eyelid: Secondary | ICD-10-CM | POA: Diagnosis not present

## 2016-07-02 DIAGNOSIS — M359 Systemic involvement of connective tissue, unspecified: Secondary | ICD-10-CM | POA: Diagnosis not present

## 2016-07-02 DIAGNOSIS — I10 Essential (primary) hypertension: Secondary | ICD-10-CM | POA: Diagnosis not present

## 2016-07-02 DIAGNOSIS — R197 Diarrhea, unspecified: Secondary | ICD-10-CM | POA: Diagnosis not present

## 2016-07-02 DIAGNOSIS — R21 Rash and other nonspecific skin eruption: Secondary | ICD-10-CM | POA: Diagnosis not present

## 2016-09-01 DIAGNOSIS — M359 Systemic involvement of connective tissue, unspecified: Secondary | ICD-10-CM | POA: Diagnosis not present

## 2016-09-01 DIAGNOSIS — R413 Other amnesia: Secondary | ICD-10-CM | POA: Diagnosis not present

## 2016-09-11 DIAGNOSIS — R194 Change in bowel habit: Secondary | ICD-10-CM | POA: Diagnosis not present

## 2016-09-11 DIAGNOSIS — Z800 Family history of malignant neoplasm of digestive organs: Secondary | ICD-10-CM | POA: Diagnosis not present

## 2016-09-11 DIAGNOSIS — Z86010 Personal history of colonic polyps: Secondary | ICD-10-CM | POA: Diagnosis not present

## 2016-09-11 DIAGNOSIS — R1310 Dysphagia, unspecified: Secondary | ICD-10-CM | POA: Diagnosis not present

## 2016-09-24 DIAGNOSIS — Z1211 Encounter for screening for malignant neoplasm of colon: Secondary | ICD-10-CM | POA: Diagnosis not present

## 2016-09-24 DIAGNOSIS — R1310 Dysphagia, unspecified: Secondary | ICD-10-CM | POA: Diagnosis not present

## 2016-09-24 DIAGNOSIS — D122 Benign neoplasm of ascending colon: Secondary | ICD-10-CM | POA: Diagnosis not present

## 2016-09-24 DIAGNOSIS — K635 Polyp of colon: Secondary | ICD-10-CM | POA: Diagnosis not present

## 2016-09-24 DIAGNOSIS — R194 Change in bowel habit: Secondary | ICD-10-CM | POA: Diagnosis not present

## 2016-09-24 DIAGNOSIS — K6389 Other specified diseases of intestine: Secondary | ICD-10-CM | POA: Diagnosis not present

## 2016-10-09 DIAGNOSIS — R1033 Periumbilical pain: Secondary | ICD-10-CM | POA: Diagnosis not present

## 2016-10-09 DIAGNOSIS — Z800 Family history of malignant neoplasm of digestive organs: Secondary | ICD-10-CM | POA: Diagnosis not present

## 2016-10-09 DIAGNOSIS — Z86010 Personal history of colonic polyps: Secondary | ICD-10-CM | POA: Diagnosis not present

## 2016-10-17 DIAGNOSIS — M545 Low back pain: Secondary | ICD-10-CM | POA: Diagnosis not present

## 2016-10-17 DIAGNOSIS — M5416 Radiculopathy, lumbar region: Secondary | ICD-10-CM | POA: Diagnosis not present

## 2016-10-23 DIAGNOSIS — M545 Low back pain: Secondary | ICD-10-CM | POA: Diagnosis not present

## 2016-10-23 DIAGNOSIS — M5416 Radiculopathy, lumbar region: Secondary | ICD-10-CM | POA: Diagnosis not present

## 2016-10-23 DIAGNOSIS — M5126 Other intervertebral disc displacement, lumbar region: Secondary | ICD-10-CM | POA: Diagnosis not present

## 2016-10-28 DIAGNOSIS — M5126 Other intervertebral disc displacement, lumbar region: Secondary | ICD-10-CM | POA: Diagnosis not present

## 2016-11-03 DIAGNOSIS — M5126 Other intervertebral disc displacement, lumbar region: Secondary | ICD-10-CM | POA: Diagnosis not present

## 2016-11-03 DIAGNOSIS — M5416 Radiculopathy, lumbar region: Secondary | ICD-10-CM | POA: Diagnosis not present

## 2016-11-03 DIAGNOSIS — M7061 Trochanteric bursitis, right hip: Secondary | ICD-10-CM | POA: Diagnosis not present

## 2016-11-06 DIAGNOSIS — M351 Other overlap syndromes: Secondary | ICD-10-CM | POA: Diagnosis not present

## 2016-11-06 DIAGNOSIS — H04123 Dry eye syndrome of bilateral lacrimal glands: Secondary | ICD-10-CM | POA: Diagnosis not present

## 2016-11-06 DIAGNOSIS — M3501 Sicca syndrome with keratoconjunctivitis: Secondary | ICD-10-CM | POA: Diagnosis not present

## 2016-11-06 DIAGNOSIS — Z79899 Other long term (current) drug therapy: Secondary | ICD-10-CM | POA: Diagnosis not present

## 2016-11-13 DIAGNOSIS — M7061 Trochanteric bursitis, right hip: Secondary | ICD-10-CM | POA: Diagnosis not present

## 2016-11-13 DIAGNOSIS — M898X5 Other specified disorders of bone, thigh: Secondary | ICD-10-CM | POA: Diagnosis not present

## 2016-11-18 DIAGNOSIS — M898X5 Other specified disorders of bone, thigh: Secondary | ICD-10-CM | POA: Diagnosis not present

## 2016-11-18 DIAGNOSIS — M545 Low back pain: Secondary | ICD-10-CM | POA: Diagnosis not present

## 2016-11-18 DIAGNOSIS — M7061 Trochanteric bursitis, right hip: Secondary | ICD-10-CM | POA: Diagnosis not present

## 2016-12-02 DIAGNOSIS — F411 Generalized anxiety disorder: Secondary | ICD-10-CM | POA: Diagnosis not present

## 2016-12-15 DIAGNOSIS — M79604 Pain in right leg: Secondary | ICD-10-CM | POA: Diagnosis not present

## 2016-12-15 DIAGNOSIS — R29898 Other symptoms and signs involving the musculoskeletal system: Secondary | ICD-10-CM | POA: Diagnosis not present

## 2016-12-15 DIAGNOSIS — R4189 Other symptoms and signs involving cognitive functions and awareness: Secondary | ICD-10-CM | POA: Diagnosis not present

## 2016-12-15 DIAGNOSIS — M545 Low back pain: Secondary | ICD-10-CM | POA: Diagnosis not present

## 2016-12-22 DIAGNOSIS — M545 Low back pain: Secondary | ICD-10-CM | POA: Diagnosis not present

## 2017-01-05 DIAGNOSIS — M545 Low back pain: Secondary | ICD-10-CM | POA: Diagnosis not present

## 2017-01-12 DIAGNOSIS — M545 Low back pain: Secondary | ICD-10-CM | POA: Diagnosis not present

## 2017-01-12 DIAGNOSIS — M351 Other overlap syndromes: Secondary | ICD-10-CM | POA: Diagnosis not present

## 2017-01-12 DIAGNOSIS — I10 Essential (primary) hypertension: Secondary | ICD-10-CM | POA: Diagnosis not present

## 2017-01-12 DIAGNOSIS — F419 Anxiety disorder, unspecified: Secondary | ICD-10-CM | POA: Diagnosis not present

## 2017-01-12 DIAGNOSIS — M47816 Spondylosis without myelopathy or radiculopathy, lumbar region: Secondary | ICD-10-CM | POA: Diagnosis not present

## 2017-01-26 DIAGNOSIS — M545 Low back pain: Secondary | ICD-10-CM | POA: Diagnosis not present

## 2017-01-27 DIAGNOSIS — M898X5 Other specified disorders of bone, thigh: Secondary | ICD-10-CM | POA: Diagnosis not present

## 2017-01-27 DIAGNOSIS — M7061 Trochanteric bursitis, right hip: Secondary | ICD-10-CM | POA: Diagnosis not present

## 2017-01-27 DIAGNOSIS — M545 Low back pain: Secondary | ICD-10-CM | POA: Diagnosis not present

## 2017-01-27 DIAGNOSIS — M5416 Radiculopathy, lumbar region: Secondary | ICD-10-CM | POA: Diagnosis not present

## 2017-02-19 DIAGNOSIS — Z1231 Encounter for screening mammogram for malignant neoplasm of breast: Secondary | ICD-10-CM | POA: Diagnosis not present

## 2017-02-19 DIAGNOSIS — Z6820 Body mass index (BMI) 20.0-20.9, adult: Secondary | ICD-10-CM | POA: Diagnosis not present

## 2017-02-19 DIAGNOSIS — Z01419 Encounter for gynecological examination (general) (routine) without abnormal findings: Secondary | ICD-10-CM | POA: Diagnosis not present

## 2017-02-19 DIAGNOSIS — Z13820 Encounter for screening for osteoporosis: Secondary | ICD-10-CM | POA: Diagnosis not present

## 2017-03-12 DIAGNOSIS — R5382 Chronic fatigue, unspecified: Secondary | ICD-10-CM | POA: Diagnosis not present

## 2017-03-12 DIAGNOSIS — M359 Systemic involvement of connective tissue, unspecified: Secondary | ICD-10-CM | POA: Diagnosis not present

## 2017-03-12 DIAGNOSIS — M3500 Sicca syndrome, unspecified: Secondary | ICD-10-CM | POA: Diagnosis not present

## 2017-03-12 DIAGNOSIS — M2550 Pain in unspecified joint: Secondary | ICD-10-CM | POA: Diagnosis not present

## 2017-05-12 DIAGNOSIS — H40013 Open angle with borderline findings, low risk, bilateral: Secondary | ICD-10-CM | POA: Diagnosis not present

## 2017-05-12 DIAGNOSIS — H25013 Cortical age-related cataract, bilateral: Secondary | ICD-10-CM | POA: Diagnosis not present

## 2017-05-12 DIAGNOSIS — Z79899 Other long term (current) drug therapy: Secondary | ICD-10-CM | POA: Diagnosis not present

## 2017-05-12 DIAGNOSIS — H04123 Dry eye syndrome of bilateral lacrimal glands: Secondary | ICD-10-CM | POA: Diagnosis not present

## 2017-05-12 DIAGNOSIS — M351 Other overlap syndromes: Secondary | ICD-10-CM | POA: Diagnosis not present

## 2017-05-12 DIAGNOSIS — H2513 Age-related nuclear cataract, bilateral: Secondary | ICD-10-CM | POA: Diagnosis not present

## 2017-05-29 DIAGNOSIS — H1013 Acute atopic conjunctivitis, bilateral: Secondary | ICD-10-CM | POA: Diagnosis not present

## 2017-05-29 DIAGNOSIS — J309 Allergic rhinitis, unspecified: Secondary | ICD-10-CM | POA: Diagnosis not present

## 2017-05-29 DIAGNOSIS — H04123 Dry eye syndrome of bilateral lacrimal glands: Secondary | ICD-10-CM | POA: Diagnosis not present

## 2017-06-19 DIAGNOSIS — H1013 Acute atopic conjunctivitis, bilateral: Secondary | ICD-10-CM | POA: Diagnosis not present

## 2017-06-19 DIAGNOSIS — H04123 Dry eye syndrome of bilateral lacrimal glands: Secondary | ICD-10-CM | POA: Diagnosis not present

## 2017-07-09 DIAGNOSIS — H04123 Dry eye syndrome of bilateral lacrimal glands: Secondary | ICD-10-CM | POA: Diagnosis not present

## 2017-07-09 DIAGNOSIS — H1013 Acute atopic conjunctivitis, bilateral: Secondary | ICD-10-CM | POA: Diagnosis not present

## 2017-07-21 DIAGNOSIS — I10 Essential (primary) hypertension: Secondary | ICD-10-CM | POA: Diagnosis not present

## 2017-07-21 DIAGNOSIS — F5101 Primary insomnia: Secondary | ICD-10-CM | POA: Diagnosis not present

## 2017-07-21 DIAGNOSIS — F419 Anxiety disorder, unspecified: Secondary | ICD-10-CM | POA: Diagnosis not present

## 2017-08-04 DIAGNOSIS — M2550 Pain in unspecified joint: Secondary | ICD-10-CM | POA: Diagnosis not present

## 2017-08-04 DIAGNOSIS — M359 Systemic involvement of connective tissue, unspecified: Secondary | ICD-10-CM | POA: Diagnosis not present

## 2017-08-04 DIAGNOSIS — M3500 Sicca syndrome, unspecified: Secondary | ICD-10-CM | POA: Diagnosis not present

## 2017-08-04 DIAGNOSIS — R5382 Chronic fatigue, unspecified: Secondary | ICD-10-CM | POA: Diagnosis not present

## 2017-09-17 DIAGNOSIS — H04123 Dry eye syndrome of bilateral lacrimal glands: Secondary | ICD-10-CM | POA: Diagnosis not present

## 2017-09-17 DIAGNOSIS — H1013 Acute atopic conjunctivitis, bilateral: Secondary | ICD-10-CM | POA: Diagnosis not present

## 2017-09-30 DIAGNOSIS — H04123 Dry eye syndrome of bilateral lacrimal glands: Secondary | ICD-10-CM | POA: Diagnosis not present

## 2017-09-30 DIAGNOSIS — H1013 Acute atopic conjunctivitis, bilateral: Secondary | ICD-10-CM | POA: Diagnosis not present

## 2017-10-26 DIAGNOSIS — M351 Other overlap syndromes: Secondary | ICD-10-CM | POA: Diagnosis not present

## 2017-10-26 DIAGNOSIS — H04123 Dry eye syndrome of bilateral lacrimal glands: Secondary | ICD-10-CM | POA: Diagnosis not present

## 2017-10-26 DIAGNOSIS — H1013 Acute atopic conjunctivitis, bilateral: Secondary | ICD-10-CM | POA: Diagnosis not present

## 2017-10-26 DIAGNOSIS — Z79899 Other long term (current) drug therapy: Secondary | ICD-10-CM | POA: Diagnosis not present

## 2017-10-29 DIAGNOSIS — F4320 Adjustment disorder, unspecified: Secondary | ICD-10-CM | POA: Diagnosis not present

## 2017-11-12 DIAGNOSIS — M3500 Sicca syndrome, unspecified: Secondary | ICD-10-CM | POA: Diagnosis not present

## 2017-11-12 DIAGNOSIS — R5382 Chronic fatigue, unspecified: Secondary | ICD-10-CM | POA: Diagnosis not present

## 2017-11-12 DIAGNOSIS — M2550 Pain in unspecified joint: Secondary | ICD-10-CM | POA: Diagnosis not present

## 2017-11-12 DIAGNOSIS — M359 Systemic involvement of connective tissue, unspecified: Secondary | ICD-10-CM | POA: Diagnosis not present

## 2018-01-21 DIAGNOSIS — M351 Other overlap syndromes: Secondary | ICD-10-CM | POA: Diagnosis not present

## 2018-01-21 DIAGNOSIS — F419 Anxiety disorder, unspecified: Secondary | ICD-10-CM | POA: Diagnosis not present

## 2018-01-21 DIAGNOSIS — I10 Essential (primary) hypertension: Secondary | ICD-10-CM | POA: Diagnosis not present

## 2018-03-11 DIAGNOSIS — Z6821 Body mass index (BMI) 21.0-21.9, adult: Secondary | ICD-10-CM | POA: Diagnosis not present

## 2018-03-11 DIAGNOSIS — Z01419 Encounter for gynecological examination (general) (routine) without abnormal findings: Secondary | ICD-10-CM | POA: Diagnosis not present

## 2018-03-11 DIAGNOSIS — Z1231 Encounter for screening mammogram for malignant neoplasm of breast: Secondary | ICD-10-CM | POA: Diagnosis not present

## 2018-03-18 DIAGNOSIS — M359 Systemic involvement of connective tissue, unspecified: Secondary | ICD-10-CM | POA: Diagnosis not present

## 2018-03-18 DIAGNOSIS — M2550 Pain in unspecified joint: Secondary | ICD-10-CM | POA: Diagnosis not present

## 2018-03-18 DIAGNOSIS — R58 Hemorrhage, not elsewhere classified: Secondary | ICD-10-CM | POA: Diagnosis not present

## 2018-04-02 DIAGNOSIS — M545 Low back pain: Secondary | ICD-10-CM | POA: Diagnosis not present

## 2018-05-17 DIAGNOSIS — Z79899 Other long term (current) drug therapy: Secondary | ICD-10-CM | POA: Diagnosis not present

## 2018-05-17 DIAGNOSIS — H40013 Open angle with borderline findings, low risk, bilateral: Secondary | ICD-10-CM | POA: Diagnosis not present

## 2018-05-17 DIAGNOSIS — H02823 Cysts of right eye, unspecified eyelid: Secondary | ICD-10-CM | POA: Diagnosis not present

## 2018-05-17 DIAGNOSIS — D2210 Melanocytic nevi of unspecified eyelid, including canthus: Secondary | ICD-10-CM | POA: Diagnosis not present

## 2018-06-04 DIAGNOSIS — H02822 Cysts of right lower eyelid: Secondary | ICD-10-CM | POA: Diagnosis not present

## 2018-06-04 DIAGNOSIS — L720 Epidermal cyst: Secondary | ICD-10-CM | POA: Diagnosis not present

## 2018-06-14 DIAGNOSIS — M545 Low back pain: Secondary | ICD-10-CM | POA: Diagnosis not present

## 2018-06-14 DIAGNOSIS — M5136 Other intervertebral disc degeneration, lumbar region: Secondary | ICD-10-CM | POA: Diagnosis not present

## 2018-06-14 DIAGNOSIS — M25511 Pain in right shoulder: Secondary | ICD-10-CM | POA: Diagnosis not present

## 2018-06-24 DIAGNOSIS — M545 Low back pain: Secondary | ICD-10-CM | POA: Diagnosis not present

## 2018-06-29 DIAGNOSIS — M545 Low back pain: Secondary | ICD-10-CM | POA: Diagnosis not present

## 2018-06-30 DIAGNOSIS — H40013 Open angle with borderline findings, low risk, bilateral: Secondary | ICD-10-CM | POA: Diagnosis not present

## 2018-07-29 DIAGNOSIS — M351 Other overlap syndromes: Secondary | ICD-10-CM | POA: Diagnosis not present

## 2018-07-29 DIAGNOSIS — F419 Anxiety disorder, unspecified: Secondary | ICD-10-CM | POA: Diagnosis not present

## 2018-07-29 DIAGNOSIS — M47816 Spondylosis without myelopathy or radiculopathy, lumbar region: Secondary | ICD-10-CM | POA: Diagnosis not present

## 2018-07-29 DIAGNOSIS — M359 Systemic involvement of connective tissue, unspecified: Secondary | ICD-10-CM | POA: Diagnosis not present

## 2018-08-03 DIAGNOSIS — D485 Neoplasm of uncertain behavior of skin: Secondary | ICD-10-CM | POA: Diagnosis not present

## 2018-08-03 DIAGNOSIS — L821 Other seborrheic keratosis: Secondary | ICD-10-CM | POA: Diagnosis not present

## 2018-09-23 DIAGNOSIS — M5440 Lumbago with sciatica, unspecified side: Secondary | ICD-10-CM | POA: Diagnosis not present

## 2018-09-23 DIAGNOSIS — R32 Unspecified urinary incontinence: Secondary | ICD-10-CM | POA: Diagnosis not present

## 2018-11-01 DIAGNOSIS — L237 Allergic contact dermatitis due to plants, except food: Secondary | ICD-10-CM | POA: Diagnosis not present

## 2018-11-01 DIAGNOSIS — L282 Other prurigo: Secondary | ICD-10-CM | POA: Diagnosis not present

## 2018-12-16 DIAGNOSIS — M13842 Other specified arthritis, left hand: Secondary | ICD-10-CM | POA: Diagnosis not present

## 2018-12-16 DIAGNOSIS — M79641 Pain in right hand: Secondary | ICD-10-CM | POA: Diagnosis not present

## 2019-01-27 DIAGNOSIS — G4700 Insomnia, unspecified: Secondary | ICD-10-CM | POA: Diagnosis not present

## 2019-01-27 DIAGNOSIS — M351 Other overlap syndromes: Secondary | ICD-10-CM | POA: Diagnosis not present

## 2019-01-27 DIAGNOSIS — F419 Anxiety disorder, unspecified: Secondary | ICD-10-CM | POA: Diagnosis not present

## 2019-01-27 DIAGNOSIS — I10 Essential (primary) hypertension: Secondary | ICD-10-CM | POA: Diagnosis not present

## 2019-01-31 DIAGNOSIS — F341 Dysthymic disorder: Secondary | ICD-10-CM | POA: Diagnosis not present

## 2019-02-03 DIAGNOSIS — M25532 Pain in left wrist: Secondary | ICD-10-CM | POA: Diagnosis not present

## 2019-02-03 DIAGNOSIS — M13842 Other specified arthritis, left hand: Secondary | ICD-10-CM | POA: Diagnosis not present

## 2019-02-08 DIAGNOSIS — F341 Dysthymic disorder: Secondary | ICD-10-CM | POA: Diagnosis not present

## 2019-02-12 DIAGNOSIS — M79642 Pain in left hand: Secondary | ICD-10-CM | POA: Diagnosis not present

## 2019-02-14 DIAGNOSIS — F341 Dysthymic disorder: Secondary | ICD-10-CM | POA: Diagnosis not present

## 2019-02-24 DIAGNOSIS — M13842 Other specified arthritis, left hand: Secondary | ICD-10-CM | POA: Diagnosis not present

## 2019-02-24 DIAGNOSIS — M359 Systemic involvement of connective tissue, unspecified: Secondary | ICD-10-CM | POA: Diagnosis not present

## 2019-02-24 DIAGNOSIS — S63522D Sprain of radiocarpal joint of left wrist, subsequent encounter: Secondary | ICD-10-CM | POA: Diagnosis not present

## 2019-02-24 DIAGNOSIS — Z79899 Other long term (current) drug therapy: Secondary | ICD-10-CM | POA: Diagnosis not present

## 2019-02-24 DIAGNOSIS — H04123 Dry eye syndrome of bilateral lacrimal glands: Secondary | ICD-10-CM | POA: Diagnosis not present

## 2019-02-24 DIAGNOSIS — I7300 Raynaud's syndrome without gangrene: Secondary | ICD-10-CM | POA: Diagnosis not present

## 2019-03-07 DIAGNOSIS — F341 Dysthymic disorder: Secondary | ICD-10-CM | POA: Diagnosis not present

## 2019-03-17 ENCOUNTER — Observation Stay (HOSPITAL_COMMUNITY)
Admission: RE | Admit: 2019-03-17 | Discharge: 2019-03-18 | Disposition: A | Discharge status: Home / Self Care | Payer: BC Managed Care – PPO | Attending: Psychiatry | Admitting: Psychiatry

## 2019-03-17 DIAGNOSIS — F329 Major depressive disorder, single episode, unspecified: Principal | ICD-10-CM | POA: Insufficient documentation

## 2019-03-17 DIAGNOSIS — M351 Other overlap syndromes: Secondary | ICD-10-CM | POA: Insufficient documentation

## 2019-03-17 DIAGNOSIS — M797 Fibromyalgia: Secondary | ICD-10-CM | POA: Diagnosis not present

## 2019-03-17 DIAGNOSIS — F419 Anxiety disorder, unspecified: Secondary | ICD-10-CM | POA: Insufficient documentation

## 2019-03-17 DIAGNOSIS — I10 Essential (primary) hypertension: Secondary | ICD-10-CM | POA: Diagnosis not present

## 2019-03-17 DIAGNOSIS — Z20828 Contact with and (suspected) exposure to other viral communicable diseases: Secondary | ICD-10-CM | POA: Diagnosis not present

## 2019-03-17 DIAGNOSIS — Z79899 Other long term (current) drug therapy: Secondary | ICD-10-CM | POA: Insufficient documentation

## 2019-03-17 DIAGNOSIS — E559 Vitamin D deficiency, unspecified: Secondary | ICD-10-CM | POA: Insufficient documentation

## 2019-03-17 DIAGNOSIS — Z7951 Long term (current) use of inhaled steroids: Secondary | ICD-10-CM | POA: Diagnosis not present

## 2019-03-18 ENCOUNTER — Encounter (HOSPITAL_COMMUNITY): Payer: Self-pay | Admitting: Emergency Medicine

## 2019-03-18 ENCOUNTER — Other Ambulatory Visit: Payer: Self-pay

## 2019-03-18 DIAGNOSIS — F331 Major depressive disorder, recurrent, moderate: Secondary | ICD-10-CM

## 2019-03-18 DIAGNOSIS — F329 Major depressive disorder, single episode, unspecified: Secondary | ICD-10-CM | POA: Diagnosis present

## 2019-03-18 LAB — LIPID PANEL
Cholesterol: 241 mg/dL — ABNORMAL HIGH (ref 0–200)
HDL: 89 mg/dL (ref >40)
LDL Cholesterol: 140 mg/dL — ABNORMAL HIGH (ref 0–99)
Total CHOL/HDL Ratio: 2.7 RATIO
Triglycerides: 58 mg/dL (ref <150)
VLDL: 12 mg/dL (ref 0–40)

## 2019-03-18 LAB — COMPREHENSIVE METABOLIC PANEL
ALT: 14 U/L (ref 0–44)
AST: 20 U/L (ref 15–41)
Albumin: 4.6 g/dL (ref 3.5–5.0)
Alkaline Phosphatase: 42 U/L (ref 38–126)
Anion gap: 9 (ref 5–15)
BUN: 12 mg/dL (ref 6–20)
CO2: 28 mmol/L (ref 22–32)
Calcium: 9.9 mg/dL (ref 8.9–10.3)
Chloride: 103 mmol/L (ref 98–111)
Creatinine, Ser: 0.79 mg/dL (ref 0.44–1.00)
GFR calc Af Amer: >60 mL/min (ref >60)
GFR calc non Af Amer: >60 mL/min (ref >60)
Glucose, Bld: 97 mg/dL (ref 70–99)
Potassium: 4 mmol/L (ref 3.5–5.1)
Sodium: 140 mmol/L (ref 135–145)
Total Bilirubin: 0.2 mg/dL — ABNORMAL LOW (ref 0.3–1.2)
Total Protein: 7.4 g/dL (ref 6.5–8.1)

## 2019-03-18 LAB — CBC
HCT: 40.3 % (ref 36.0–46.0)
Hemoglobin: 13.1 g/dL (ref 12.0–15.0)
MCH: 30.5 pg (ref 26.0–34.0)
MCHC: 32.5 g/dL (ref 30.0–36.0)
MCV: 93.7 fL (ref 80.0–100.0)
Platelets: 205 10*3/uL (ref 150–400)
RBC: 4.3 MIL/uL (ref 3.87–5.11)
RDW: 12.3 % (ref 11.5–15.5)
WBC: 6.5 10*3/uL (ref 4.0–10.5)
nRBC: 0 % (ref 0.0–0.2)

## 2019-03-18 LAB — HEMOGLOBIN A1C
Hgb A1c MFr Bld: 5.5 % (ref 4.8–5.6)
Mean Plasma Glucose: 111.15 mg/dL

## 2019-03-18 LAB — BILIRUBIN, DIRECT: Bilirubin, Direct: 0.1 mg/dL (ref 0.0–0.2)

## 2019-03-18 LAB — TSH: TSH: 1.637 u[IU]/mL (ref 0.350–4.500)

## 2019-03-18 LAB — SARS CORONAVIRUS 2 (TAT 6-24 HRS): SARS Coronavirus 2: NEGATIVE

## 2019-03-18 LAB — ETHANOL: Alcohol, Ethyl (B): <10 mg/dL (ref <10)

## 2019-03-18 MED ORDER — ACETAMINOPHEN 325 MG PO TABS: 650.0000 mg | ORAL_TABLET | Freq: Four times a day (QID) | ORAL | Status: DC | PRN

## 2019-03-18 MED ORDER — ALUM & MAG HYDROXIDE-SIMETH 200-200-20 MG/5ML PO SUSP: 30.0000 mL | ORAL | Status: DC | PRN

## 2019-03-18 MED ORDER — MAGNESIUM HYDROXIDE 400 MG/5ML PO SUSP: 30.0000 mL | Freq: Every day | ORAL | Status: DC | PRN

## 2019-03-18 MED ORDER — ACETAMINOPHEN 500 MG PO TABS
1000.0000 mg | ORAL_TABLET | Freq: Once | ORAL | Status: AC
  Administered 2019-03-18: 1000 mg via ORAL
  Filled 2019-03-18: qty 2

## 2019-03-18 MED ORDER — HYDROXYZINE HCL 25 MG PO TABS: 25.0000 mg | ORAL_TABLET | Freq: Three times a day (TID) | ORAL | Status: DC | PRN

## 2019-03-18 MED ORDER — TRAZODONE HCL 50 MG PO TABS
50.0000 mg | ORAL_TABLET | Freq: Every evening | ORAL | Status: DC | PRN
  Administered 2019-03-18: 50 mg via ORAL
  Filled 2019-03-18: qty 1

## 2019-03-18 NOTE — Discharge Summary (Addendum)
Physician Discharge Summary Note  Patient:  Crystal Davenport is an 53 y.o., female MRN:  [id number] DOB:  [birth year] Patient phone:  [phone] (home)  Patient address:   [place] [address],  Total Time spent with patient: 30 minutes  Date of Admission:  03/17/2019 Date of Discharge: 03/17/2019  Reason for Admission:  Pt was seen and chart reviewed with treatment team and Dr Mariea Clonts.  Pt presented to [facility] as a walk-in due to a conflict between herself and her daughters. Pt denies suicidal and homicidal ideation, plan or intent. Pt is interested in outpatient resources for counseling for herself and her family. Pt stated her 45 year old daughter just graduated from college and because of [place] her job prospect fell apart and she is angry. Pt lives with her husband and 3 daughters. Pt is able to contract for safety upon discharge. Outpatient resources were provided to patient. Pt is psychiatrically clear.   Principal Problem: MDD (major depressive disorder) Discharge Diagnoses: Principal Problem:   MDD (major depressive disorder)   Past Psychiatric History: Depression  Past Medical History:  Past Medical History:  Diagnosis Date  . Anxiety   . Chronic joint pain   . Chronically dry eyes   . Depression   . Mixed connective tissue disease ([place])   . Rectal adenoma    SERRATED INCOMPLETE REMOVAL INTRAOP COLONOSCOPY 06-22-2015  . Urge urinary incontinence     Past Surgical History:  Procedure Laterality Date  . COLONOSCOPY  06-22-2015  . D & C HYSTEROSCOPY/  ENDOMETRIAL ABLATION  2015  . NASAL SINUS SURGERY  1992   Family History: History reviewed. No pertinent family history. Family Psychiatric  History: None per chart review.  Social History:  Social History   Substance and Sexual Activity  Alcohol Use Yes   Comment: OCCASIONAL     Social History   Substance and Sexual Activity  Drug Use No    Social History   Socioeconomic History  . Marital status:  Married    Spouse name: Not on file  . Number of children: Not on file  . Years of education: Not on file  . Highest education level: Not on file  Occupational History  . Not on file  Social Needs  . Financial resource strain: Not on file  . Food insecurity    Worry: Not on file    Inability: Not on file  . Transportation needs    Medical: Not on file    Non-medical: Not on file  Tobacco Use  . Smoking status: Never Smoker  . Smokeless tobacco: Never Used  Substance and Sexual Activity  . Alcohol use: Yes    Comment: OCCASIONAL  . Drug use: No  . Sexual activity: Not on file  Lifestyle  . Physical activity    Days per week: Not on file    Minutes per session: Not on file  . Stress: Not on file  Relationships  . Social Herbalist on phone: Not on file    Gets together: Not on file    Attends religious service: Not on file    Active member of club or organization: Not on file    Attends meetings of clubs or organizations: Not on file    Relationship status: Not on file  Other Topics Concern  . Not on file  Social History Narrative  . Not on file      Physical Findings: AIMS: Facial and Oral Movements Muscles of  Facial Expression: None, normal Lips and Perioral Area: None, normal Jaw: None, normal Tongue: None, normal,Extremity Movements Upper (arms, wrists, hands, fingers): None, normal Lower (legs, knees, ankles, toes): None, normal, Trunk Movements Neck, shoulders, hips: None, normal, Overall Severity Severity of abnormal movements (highest score from questions above): None, normal Incapacitation due to abnormal movements: None, normal Patient's awareness of abnormal movements (rate only patient's report): No Awareness, Dental Status Current problems with teeth and/or dentures?: No Does patient usually wear dentures?: No  CIWA:  CIWA-Ar Total: 3 COWS:  COWS Total Score: 3 Musculoskeletal: Strength & Muscle Tone: within normal limits Gait &  Station: normal Patient leans: N/A  Psychiatric Specialty Exam:   Blood pressure (!) 174/105, pulse 82, temperature 98.6 F (37 C), temperature source Oral, resp. rate 18.There is no height or weight on file to calculate BMI.  General Appearance: Casual  Eye Contact::  Good  Speech:  Clear and Coherent and Normal Rate  Volume:  Normal  Mood:  Anxious  Affect:  Congruent  Thought Process:  Coherent, Goal Directed and Descriptions of Associations: Intact  Orientation:  Full (Time, Place, and Person)  Thought Content:  Logical  Suicidal Thoughts:  No  Homicidal Thoughts:  No  Memory:  Immediate;   Good Recent;   Good Remote;   Fair  Judgement:  Fair  Insight:  Fair  Psychomotor Activity:  Normal  Concentration:  Good  Recall:  Good  Fund of Knowledge:Good  Language: Good  Akathisia:  Negative  Handed:  Right  AIMS (if indicated):     Assets:  Agricultural consultant Housing Physical Health Social Support Vocational/Educational  Sleep:   N/A  Cognition: WNL  ADL's:  Intact     Has this patient used any form of tobacco in the last 30 days? (Cigarettes, Smokeless Tobacco, Cigars, and/or Pipes) N/A  Blood Alcohol level:  Lab Results  Component Value Date   ETH <10 03/18/2019   ETH <11 0000000    Metabolic Disorder Labs:  Lab Results  Component Value Date   HGBA1C 5.5 03/18/2019   MPG 111.15 03/18/2019   No results found for: PROLACTIN Lab Results  Component Value Date   CHOL 241 (H) 03/18/2019   TRIG 58 03/18/2019   HDL 89 03/18/2019   CHOLHDL 2.7 03/18/2019   VLDL 12 03/18/2019   LDLCALC 140 (H) 03/18/2019    See Psychiatric Specialty Exam and Suicide Risk Assessment completed by Attending Physician prior to discharge.  Discharge destination:  Home  Is patient on multiple antipsychotic therapies at discharge:  No   Has Patient had three or more failed trials of antipsychotic monotherapy by history:  No  Recommended  Plan for Multiple Antipsychotic Therapies: NA  Discharge Instructions    Diet - low sodium heart healthy   Complete by: As directed    Increase activity slowly   Complete by: As directed      Allergies as of 03/18/2019      Reactions   Codeine       Medication List    TAKE these medications     Indication  ALPRAZolam 0.5 MG tablet Commonly known as: XANAX Take 0.5 mg by mouth at bedtime as needed for anxiety.  Indication: Feeling Anxious   amLODipine 5 MG tablet Commonly known as: NORVASC Take 5 mg by mouth daily.  Indication: High Blood Pressure Disorder   cyclobenzaprine 5 MG tablet Commonly known as: FLEXERIL Take 5 mg by mouth 3 (three) times daily as needed  for muscle spasms.  Indication: Fibromyalgia Syndrome   FLUoxetine 10 MG capsule Commonly known as: PROZAC Take 10 mg by mouth every morning.  Indication: Depression   fluticasone 50 MCG/ACT nasal spray Commonly known as: FLONASE Place 1 spray into both nostrils daily.  Indication: Allergic Rhinitis   gabapentin 300 MG capsule Commonly known as: NEURONTIN Take 300 mg by mouth at bedtime.  Indication: Fibromyalgia Syndrome   hydroxychloroquine 200 MG tablet Commonly known as: PLAQUENIL Take 200 mg by mouth every morning.  Indication: MIXED CONNECTIVE TISSUE DISEASE   vitamin B-12 1000 MCG tablet Commonly known as: CYANOCOBALAMIN Take 1,000 mcg by mouth daily.  Indication: Inadequate Vitamin B12   Vitamin D3 50 MCG (2000 UT) Tabs Take 1 capsule by mouth daily.  Indication: Vitamin D Deficiency       Follow-up recommendations:  Activity:  as tolerated Diet:  Heart Healthy  Comments and Treatment Plan: MDD (major depressive disorder) Take all medications as prescribed by your outpatient provider Keep all follow-up appointments as scheduled.  Do not consume alcohol or use illegal drugs while on prescription medications. Report any adverse effects from your medications to your primary care  provider promptly.  In the event of recurrent symptoms or worsening symptoms, call 911, a crisis hotline, or go to the nearest emergency department for evaluation.   Signed: Ethelene Hal, NP 03/18/2019, 9:51 AM   Patient's chart reviewed. Reviewed the information documented and agree with the treatment plan.  Buford Dresser, DO 03/18/19 4:34 PM

## 2019-03-18 NOTE — Discharge Instructions (Signed)
For your mental health needs, you are advised to follow up with [facility].  New and returning patients are seen at their walk-in clinic.  Walk-in hours are Monday - Friday from 8:00 am - 3:00 pm.  Walk-in patients are seen on a first come, first served basis.  Try to arrive as early as possible for he best chance of being seen the same day: ° °     [facility] °     [address] °     [place],  [address] °     [phone] °

## 2019-03-18 NOTE — Progress Notes (Signed)
Patient ID: Crystal Davenport, female   DOB: 1966-06-19, 53 y.o.   MRN: [id number] Pt presents with feeling of hopelessness, sadness and lack of respect after catching young adult children smoking marijuana in the home.  Pt crying tearful and complaint of headache and runny nose.  Denies SI, HI or AVH.  Skin search completed, pt changed into scrubs.  Monitoring for safety, Pending COVID results.

## 2019-03-18 NOTE — BH Assessment (Addendum)
Assessment Note  Crystal Davenport is a 53 y.o. female who brought herself to [facility] due to increased conflict between herself and her daughters, whom have recently returned home after living independently. Pt shares her 27 year old daughter returned home from [place]. due to being told she could now work remotely, her 62 year old daughter recently graduated and does not yet have a job, and her 69 year old was unable to find friends to get an apartment with; these circumstances are all due to [place], and pt and her husband had been empty-nesters prior to the pandemic.  Pt shares her daughters are been disrespectful and dismissive of she and her husband's rules, including, but not limited to, smoking marijuana at the home. Pt states she has tried to discuss this with her daughters but that they argue with her or lie/question her rules. Pt acknowledges her negative thoughts of self-worth being so bad tonight that she had thoughts of SI and considered killing herself; she states she had one attempt of killing herself 5-6 years ago. Pt states she does not currently have a therapist nor a psychiatrist but that her family doctor prescribes her Prozac, though she's unsure it's working for her at this time. Pt denies HI, AVH, NSSIB, access to guns/weapons, engagement in the legal system, or SA other than occasional use of EtOH.  Pt's husband expressed thoughts that it would be beneficial for pt to spend the night at the hospital so as not to return home and be re-instigated by her daughters, which would again cause her upset feelings. Pt expressed this would be of benefit and they agreed pt being observed overnight would be of benefit.  Pt is oriented x4. Her recent and remote memory is intact, though pt states she had a bad headache tonight, which she almost never gets, which was affecting her thinking. Pt was cooperative, though quite tearful, throughout the assessment process. Pt's insight, judgement, and impulse  control is impaired at this time.   Diagnosis: F33.2, Major depressive disorder, Recurrent episode, Severe   Past Medical History:  Past Medical History:  Diagnosis Date  . Anxiety   . Chronic joint pain   . Chronically dry eyes   . Depression   . Mixed connective tissue disease ([place])   . Rectal adenoma    SERRATED INCOMPLETE REMOVAL INTRAOP COLONOSCOPY 06-22-2015  . Urge urinary incontinence     Past Surgical History:  Procedure Laterality Date  . COLONOSCOPY  06-22-2015  . D & C HYSTEROSCOPY/  ENDOMETRIAL ABLATION  2015  . NASAL SINUS SURGERY  1992    Family History: No family history on file.  Social History:  reports that she has never smoked. She has never used smokeless tobacco. She reports current alcohol use. She reports that she does not use drugs.  Additional Social History:  Alcohol / Drug Use Pain Medications: Please see MAR Prescriptions: Please see MAR Over the Counter: Please see MAR History of alcohol / drug use?: Yes Longest period of sobriety (when/how long): Unknown Substance #1 Name of Substance 1: EtOH 1 - Age of First Use: Unknown 1 - Amount (size/oz): Several glasses of wine 1 - Frequency: Several times/month 1 - Duration: Unknown 1 - Last Use / Amount: Unknown  CIWA:   COWS:    Allergies: No Known Allergies  Home Medications:  Medications Prior to Admission  Medication Sig Dispense Refill  . ALPRAZolam (XANAX) 0.5 MG tablet Take 0.5 mg by mouth at bedtime as needed for anxiety.     Marland Kitchen  Cholecalciferol (VITAMIN D3) 2000 units TABS Take 1 capsule by mouth daily.    . Cyanocobalamin (B-12) 5000 MCG CAPS Take 1 capsule by mouth daily.    . cycloSPORINE (RESTASIS) 0.05 % ophthalmic emulsion Place 1 drop into both eyes 2 (two) times daily.    Marland Kitchen FLUoxetine (PROZAC) 10 MG capsule Take 10 mg by mouth every morning.     . hydroxychloroquine (PLAQUENIL) 200 MG tablet Take 200 mg by mouth every morning.    Marland Kitchen oxyCODONE-acetaminophen (ROXICET) 5-325  MG tablet Take 1-2 tablets by mouth every 4 (four) hours as needed for severe pain. 30 tablet 0  . solifenacin (VESICARE) 5 MG tablet Take 5 mg by mouth every morning.      OB/GYN Status:  No LMP recorded. Patient has had an ablation.  General Assessment Data Location of Assessment: [facility] Assessment Services TTS Assessment: In system Is this a Tele or Face-to-Face Assessment?: Face-to-Face Is this an Initial Assessment or a Re-assessment for this encounter?: Initial Assessment Patient Accompanied by:: N/A Language Other than English: No Living Arrangements: Other (Comment)(Pt lives with her husband and 3 daughters) What gender do you identify as?: Female Marital status: Married Pharmacist, community name: [place] Pregnancy Status: No Living Arrangements: Spouse/significant other, Children Can pt return to current living arrangement?: Yes Admission Status: Voluntary Is patient capable of signing voluntary admission?: Yes Referral Source: Self/Family/Friend Insurance type: [place] Screening Exam ([place]) Medical Exam completed: Yes  Crisis Care Plan Living Arrangements: Spouse/significant other, Children Legal Guardian: Other:(Self) Name of Psychiatrist: None Name of Therapist: None  Education Status Is patient currently in school?: No Is the patient employed, unemployed or receiving disability?: Unemployed  Risk to self with the past 6 months Suicidal Ideation: Yes-Currently Present Has patient been a risk to self within the past 6 months prior to admission? : No Suicidal Intent: Yes-Currently Present Has patient had any suicidal intent within the past 6 months prior to admission? : No Is patient at risk for suicide?: Yes Suicidal Plan?: Yes-Currently Present Has patient had any suicidal plan within the past 6 months prior to admission? : No Specify Current Suicidal Plan: Pt shares she was considering ways to harm herself while driving; has hx of intentional medication  o/d Access to Means: Yes Specify Access to Suicidal Means: Pt has a vehicle, medication access What has been your use of drugs/alcohol within the last 12 months?: Pt acknowledges use of EtOH Previous Attempts/Gestures: Yes How many times?: 1 Other Self Harm Risks: Pt's daughters returning to the home has been difficult for everyone Triggers for Past Attempts: Family contact, Unpredictable Intentional Self Injurious Behavior: None Family Suicide History: Unable to assess Recent stressful life event(s): Conflict (Comment)(Pt has been experiencing conflict w/ her daughters) Persecutory voices/beliefs?: No Depression: Yes Depression Symptoms: Despondent, Tearfulness, Fatigue, Guilt, Feeling worthless/self pity, Feeling angry/irritable Substance abuse history and/or treatment for substance abuse?: No Suicide prevention information given to non-admitted patients: Not applicable  Risk to Others within the past 6 months Homicidal Ideation: No Does patient have any lifetime risk of violence toward others beyond the six months prior to admission? : No Thoughts of Harm to Others: No Current Homicidal Intent: No Current Homicidal Plan: No Access to Homicidal Means: No Identified Victim: None noted History of harm to others?: No Assessment of Violence: None Noted Violent Behavior Description: None noted Does patient have access to weapons?: No(Pt denies access to guns/weapons) Criminal Charges Pending?: No Does patient have a court date: No Is patient on  probation?: No  Psychosis Hallucinations: None noted Delusions: None noted  Mental Status Report Appearance/Hygiene: Unremarkable Eye Contact: Good Motor Activity: Unremarkable Speech: Logical/coherent Level of Consciousness: Crying Mood: Depressed, Anxious, Empty, Worthless, low self-esteem Affect: Appropriate to circumstance Anxiety Level: Minimal Thought Processes: Coherent, Relevant Judgement: Partial Orientation: Person,  Place, Time, Situation Obsessive Compulsive Thoughts/Behaviors: Moderate  Cognitive Functioning Concentration: Fair Memory: Recent Intact, Remote Intact Is patient IDD: No Insight: Fair Impulse Control: Fair Appetite: Fair Have you had any weight changes? : No Change Sleep: Unable to Assess Total Hours of Sleep: (UTA) Vegetative Symptoms: Unable to Assess  ADLScreening [facility]) Patient's cognitive ability adequate to safely complete daily activities?: Yes Patient able to express need for assistance with ADLs?: Yes Independently performs ADLs?: Yes (appropriate for developmental age)  Prior Inpatient Therapy Prior Inpatient Therapy: No  Prior Outpatient Therapy Prior Outpatient Therapy: No Does patient have an ACCT team?: No Does patient have Intensive In-House Services?  : No Does patient have [facility] services? : No Does patient have P4CC services?: No  ADL Screening (condition at time of admission) Patient's cognitive ability adequate to safely complete daily activities?: Yes Is the patient deaf or have difficulty hearing?: No Does the patient have difficulty seeing, even when wearing glasses/contacts?: No Does the patient have difficulty concentrating, remembering, or making decisions?: No Patient able to express need for assistance with ADLs?: Yes Does the patient have difficulty dressing or bathing?: No Independently performs ADLs?: Yes (appropriate for developmental age) Does the patient have difficulty walking or climbing stairs?: No Weakness of Legs: None Weakness of Arms/Hands: None  Home Assistive Devices/Equipment Home Assistive Devices/Equipment: None  Therapy Consults (therapy consults require a physician order) PT Evaluation Needed: No OT Evalulation Needed: No SLP Evaluation Needed: No Abuse/Neglect Assessment (Assessment to be complete while patient is alone) Abuse/Neglect Assessment Can Be Completed: Unable to assess, patient is  non-responsive or altered mental status Values / Beliefs Cultural Requests During Hospitalization: None Spiritual Requests During Hospitalization: None Consults Spiritual Care Consult Needed: No Social Work Consult Needed: No Regulatory affairs officer (For Healthcare) Does Patient Have a Medical Advance Directive?: Unable to assess, patient is non-responsive or altered mental status       Disposition: Anette Riedel, NP, reviewed pt's chart and information and met with pt and her husband and determined pt should be observed overnight in the Observation Unit for safety and stability and re-assessed in the morning by psychiatry. Pt will be in room 206-1.   Disposition Initial Assessment Completed for this Encounter: Yes Disposition of Patient: Anette Riedel, NP determined pt should be observed overnight) Patient refused recommended treatment: No Mode of transportation if patient is discharged/movement?: N/A Patient referred to: Other (Comment)(Pt will be observed overnight for safety and stability)  On Site Evaluation by:   Reviewed with Physician:    Dannielle Burn 03/18/2019 12:52 AM

## 2019-03-18 NOTE — H&P (Signed)
Behavioral Health Medical Screening Exam  Crystal Davenport is an 53 y.o. female. The following assessment is as per TTS which writer concurs: Crystal Davenport is a 53 y.o. female who brought herself to [place] due to increased conflict between herself and her daughters, whom have recently returned home after living independently. Pt shares her 18 year old daughter returned home from [place]. due to being told she could now work remotely, her 68 year old daughter recently graduated and does not yet have a job, and her 64 year old was unable to find friends to get an apartment with; these circumstances are all due to [place], and pt and her husband had been empty-nesters prior to the pandemic.  Pt shares her daughters are been disrespectful and dismissive of she and her husband's rules, including, but not limited to, smoking marijuana at the home. Pt states she has tried to discuss this with her daughters but that they argue with her or lie/question her rules. Pt acknowledges her negative thoughts of self-worth being so bad tonight that she had thoughts of SI and considered killing herself; she states she had one attempt of killing herself 5-6 years ago. Pt states she does not currently have a therapist nor a psychiatrist but that her family doctor prescribes her Prozac, though she's unsure it's working for her at this time. Pt denies HI, AVH, NSSIB, access to guns/weapons, engagement in the legal system, or SA other than occasional use of EtOH.  Pt's husband expressed thoughts that it would be beneficial for pt to spend the night at the hospital so as not to return home and be re-instigated by her daughters, which would again cause her upset feelings. Pt expressed this would be of benefit and they agreed pt being observed overnight would be of benefit.  Pt is oriented x4. Her recent and remote memory is intact, though pt states she had a bad headache tonight, which she almost never gets, which was affecting  her thinking. Pt was cooperative, though quite tearful, throughout the assessment process. Pt's insight, judgement, and impulse control is impaired at this time.  Total Time spent with patient: 30 minutes  Psychiatric Specialty Exam: Physical Exam  Constitutional: She is oriented to person, place, and time. She appears well-developed.  HENT:  Head: Normocephalic.  Eyes: Pupils are equal, round, and reactive to light.  Neck: Normal range of motion.  Respiratory: Effort normal.  Musculoskeletal: Normal range of motion.  Neurological: She is alert and oriented to person, place, and time.  Skin: Skin is warm and dry.  Psychiatric: Her speech is normal. Her mood appears anxious. She is agitated. Thought content is paranoid. Cognition and memory are impaired. She expresses impulsivity. She exhibits a depressed mood. She expresses suicidal ideation.    ROS  Blood pressure (!) 174/105, pulse 82, temperature 98.6 F (37 C), temperature source Oral, resp. rate 18.There is no height or weight on file to calculate BMI.  General Appearance: Casual  Eye Contact:  Fair  Speech:  Normal Rate  Volume:  Normal  Mood:  Depressed and Hopeless  Affect:  Congruent  Thought Process:  Coherent and Descriptions of Associations: Circumstantial  Orientation:  Full (Time, Place, and Person)  Thought Content:  Negative  Suicidal Thoughts:  Yes.  without intent/plan  Homicidal Thoughts:  No  Memory:  Immediate;   Good  Judgement:  Good  Insight:  Lacking  Psychomotor Activity:  Normal  Concentration: Concentration: Poor  Recall:  Good  Fund of Knowledge:Good  Language: Good  Akathisia:  NA  Handed:  Right  AIMS (if indicated):     Assets:  Communication Skills Desire for Improvement Social Support  Sleep:   no sleep    Musculoskeletal: Strength & Muscle Tone: within normal limits Gait & Station: normal Patient leans: N/A  Blood pressure (!) 174/105, pulse 82, temperature 98.6 F (37 C),  temperature source Oral, resp. rate 18.  Recommendations:  Based on my evaluation the patient does not appear to have an emergency medical condition.  Disposition: .Disposition: Recommend psychiatric Inpatient admission when medically cleared. Supportive therapy provided about ongoing stressors.Deloria Lair, NP 03/18/2019, 5:02 AM

## 2019-03-18 NOTE — Plan of Care (Signed)
[facility] Observation Crisis Plan  Reason for Crisis Plan:  Crisis Stabilization   Plan of Care:  Referral for Inpatient Hospitalization  Family Support:   no   Current Living Environment:  Living Arrangements: Spouse/significant other, Children  Insurance:   Hospital Account    Name Acct ID Class Status Primary Coverage   Roniyah, [place] [id number] [place] - [employer]BS COMM PPO        Guarantor Account (for Hospital Account [id number])    Name Relation to Pt Service Area Active? Acct Type   Jorje Guild Self [facility] Yes [facility]   Address Phone       [place], [place] [address] [phone](H)          Coverage Information (for Hospital Account [id number])    F/O Payor/Plan Precert #   [facility] SHIELD/BCBS COMM PPO    Subscriber Subscriber #   Tenile, Ulery [id number]   Address Phone   [address] [place], [place] [address] [phone]      Legal Guardian:  Legal Guardian: Other:(Self)  Primary Care Provider:  Juanda Chance, NP  Current Outpatient Providers:  Buford Dresser, DO  Psychiatrist:  Name of Psychiatrist: None  Counselor/Therapist:  Name of Therapist: None  Compliant with Medications:  yes  Additional Information:   Jesusita Oka 9/4/20202:05 AM

## 2019-03-18 NOTE — H&P (Addendum)
Behavioral Health Medical Screening Exam  Crystal Davenport is an 53 y.o. female. The following assessment is as per TTS which writer concurs: Crystal Davenport is a 53 y.o. female who brought herself to [place] due to increased conflict between herself and her daughters, whom have recently returned home after living independently. Pt shares her 30 year old daughter returned home from [place]. due to being told she could now work remotely, her 33 year old daughter recently graduated and does not yet have a job, and her 76 year old was unable to find friends to get an apartment with; these circumstances are all due to [place], and pt and her husband had been empty-nesters prior to the pandemic.  Pt shares her daughters are been disrespectful and dismissive of she and her husband's rules, including, but not limited to, smoking marijuana at the home. Pt states she has tried to discuss this with her daughters but that they argue with her or lie/question her rules. Pt acknowledges her negative thoughts of self-worth being so bad tonight that she had thoughts of SI and considered killing herself; she states she had one attempt of killing herself 5-6 years ago. Pt states she does not currently have a therapist nor a psychiatrist but that her family doctor prescribes her Prozac, though she's unsure it's working for her at this time. Pt denies HI, AVH, NSSIB, access to guns/weapons, engagement in the legal system, or SA other than occasional use of EtOH.  Pt's husband expressed thoughts that it would be beneficial for pt to spend the night at the hospital so as not to return home and be re-instigated by her daughters, which would again cause her upset feelings. Pt expressed this would be of benefit and they agreed pt being observed overnight would be of benefit.  Pt is oriented x4. Her recent and remote memory is intact, though pt states she had a bad headache tonight, which she almost never gets, which was affecting  her thinking. Pt was cooperative, though quite tearful, throughout the assessment process. Pt's insight, judgement, and impulse control is impaired at this time.  Total Time spent with patient: 30 minutes  Psychiatric Specialty Exam: Physical Exam  Constitutional: She is oriented to person, place, and time. She appears well-developed.  HENT:  Head: Normocephalic.  Eyes: Pupils are equal, round, and reactive to light.  Neck: Normal range of motion.  Respiratory: Effort normal.  Musculoskeletal: Normal range of motion.  Neurological: She is alert and oriented to person, place, and time.  Skin: Skin is warm and dry.  Psychiatric: Her speech is normal. Her mood appears anxious. She is agitated. Thought content is paranoid. Cognition and memory are impaired. She expresses impulsivity. She exhibits a depressed mood. She expresses suicidal ideation.    ROS   Blood pressure (!) 174/105, pulse 82, temperature 98.6 F (37 C), temperature source Oral, resp. rate 18.There is no height or weight on file to calculate BMI.  General Appearance: Casual  Eye Contact:  Fair  Speech:  Normal Rate  Volume:  Normal  Mood:  Depressed and Hopeless  Affect:  Congruent  Thought Process:  Coherent and Descriptions of Associations: Circumstantial  Orientation:  Full (Time, Place, and Person)  Thought Content:  Negative  Suicidal Thoughts:  Yes.  without intent/plan  Homicidal Thoughts:  No  Memory:  Immediate;   Good  Judgement:  Good  Insight:  Lacking  Psychomotor Activity:  Normal  Concentration: Concentration: Poor  Recall:  Good  Fund of Knowledge:Good  Language: Good  Akathisia:  NA  Handed:  Right  AIMS (if indicated):     Assets:  Communication Skills Desire for Improvement Social Support  Sleep:   no sleep    Musculoskeletal: Strength & Muscle Tone: within normal limits Gait & Station: normal Patient leans: N/A  Blood pressure (!) 174/105, pulse 82, temperature 98.6 F (37 C),  temperature source Oral, resp. rate 18.  Recommendations:  Based on my evaluation the patient does not appear to have an emergency medical condition.   .Disposition: Recommend psychiatric Inpatient admission when medically cleared. Supportive therapy provided about ongoing stressors.Deloria Lair, NP 03/18/2019, 5:08 AM    Patient's chart reviewed. Reviewed the information documented and agree with the treatment plan.  Buford Dresser, DO 03/18/19 4:29 PM

## 2019-03-18 NOTE — [facility] Suicide Risk Assessment (Signed)
Suicide Risk Assessment  Discharge Assessment   [facility] Discharge Suicide Risk Assessment   Principal Problem: MDD (major depressive disorder) Discharge Diagnoses: Principal Problem:   MDD (major depressive disorder)   Total Time spent with patient: 30 minutes  Musculoskeletal: Strength & Muscle Tone: within normal limits Gait & Station: normal Patient leans: N/A  Psychiatric Specialty Exam:   Blood pressure (!) 174/105, pulse 82, temperature 98.6 F (37 C), temperature source Oral, resp. rate 18.There is no height or weight on file to calculate BMI.  General Appearance: Casual  Eye Contact::  Good  Speech:  Clear and Coherent and Normal Rate409  Volume:  Normal  Mood:  Anxious  Affect:  Congruent  Thought Process:  Coherent, Goal Directed and Descriptions of Associations: Intact  Orientation:  Full (Time, Place, and Person)  Thought Content:  Logical  Suicidal Thoughts:  No  Homicidal Thoughts:  No  Memory:  Immediate;   Good Recent;   Good Remote;   Fair  Judgement:  Fair  Insight:  Fair  Psychomotor Activity:  Normal  Concentration:  Good  Recall:  Good  Fund of Knowledge:Good  Language: Good  Akathisia:  Negative  Handed:  Right  AIMS (if indicated):     Assets:  Agricultural consultant Housing Physical Health Social Support Vocational/Educational  Sleep:     Cognition: WNL  ADL's:  Intact   Mental Status Per Nursing Assessment::   On Admission:  Pt presented to [facility] as a walk-in due to a conflict between herself and her daughters. Pt denies suicidal and homicidal ideation, plan or intent. Pt is interested in outpatient resources for counseling for herself and her family. Pt stated her 50 year old daughter just graduated from college and because of [place] her job prospect fell apart and she is angry. Pt lives with her husband and 3 daughters. Pt is able to contract for safety upon discharge. Out patient resources were provided to  patient. Pt is psychiatrically clear.   Demographic Factors:  Caucasian  Loss Factors: NA  Historical Factors: Family history of mental illness or substance abuse  Risk Reduction Factors:   Sense of responsibility to family and Living with another person, especially a relative  Continued Clinical Symptoms:  Depression:   Insomnia  Cognitive Features That Contribute To Risk:  Closed-mindedness    Suicide Risk:  Minimal: No identifiable suicidal ideation.  Patients presenting with no risk factors but with morbid ruminations; may be classified as minimal risk based on the severity of the depressive symptoms    Plan Of Care/Follow-up recommendations:  Activity:  as tolerated Diet:  Heart Healthy  Ethelene Hal, NP 03/18/2019, 9:24 AM

## 2019-03-19 LAB — PROLACTIN: Prolactin: 19.1 ng/mL (ref 4.8–23.3)

## 2019-04-18 DIAGNOSIS — Z13820 Encounter for screening for osteoporosis: Secondary | ICD-10-CM | POA: Diagnosis not present

## 2019-04-18 DIAGNOSIS — N951 Menopausal and female climacteric states: Secondary | ICD-10-CM | POA: Diagnosis not present

## 2019-04-18 DIAGNOSIS — Z01419 Encounter for gynecological examination (general) (routine) without abnormal findings: Secondary | ICD-10-CM | POA: Diagnosis not present

## 2019-04-18 DIAGNOSIS — Z6820 Body mass index (BMI) 20.0-20.9, adult: Secondary | ICD-10-CM | POA: Diagnosis not present

## 2019-04-18 DIAGNOSIS — Z1231 Encounter for screening mammogram for malignant neoplasm of breast: Secondary | ICD-10-CM | POA: Diagnosis not present

## 2019-04-25 DIAGNOSIS — L03011 Cellulitis of right finger: Secondary | ICD-10-CM | POA: Diagnosis not present

## 2019-07-01 DIAGNOSIS — M359 Systemic involvement of connective tissue, unspecified: Secondary | ICD-10-CM | POA: Diagnosis not present

## 2019-07-01 DIAGNOSIS — M419 Scoliosis, unspecified: Secondary | ICD-10-CM | POA: Diagnosis not present

## 2019-07-01 DIAGNOSIS — M545 Low back pain: Secondary | ICD-10-CM | POA: Diagnosis not present

## 2019-07-07 DIAGNOSIS — M25531 Pain in right wrist: Secondary | ICD-10-CM | POA: Diagnosis not present

## 2019-07-07 DIAGNOSIS — M13842 Other specified arthritis, left hand: Secondary | ICD-10-CM | POA: Diagnosis not present

## 2019-07-07 DIAGNOSIS — S63522D Sprain of radiocarpal joint of left wrist, subsequent encounter: Secondary | ICD-10-CM | POA: Diagnosis not present

## 2019-07-20 DIAGNOSIS — M79641 Pain in right hand: Secondary | ICD-10-CM | POA: Diagnosis not present

## 2019-07-28 DIAGNOSIS — M7712 Lateral epicondylitis, left elbow: Secondary | ICD-10-CM | POA: Diagnosis not present

## 2019-07-28 DIAGNOSIS — M79641 Pain in right hand: Secondary | ICD-10-CM | POA: Diagnosis not present

## 2019-08-16 DIAGNOSIS — R5382 Chronic fatigue, unspecified: Secondary | ICD-10-CM | POA: Diagnosis not present

## 2019-08-16 DIAGNOSIS — M2550 Pain in unspecified joint: Secondary | ICD-10-CM | POA: Diagnosis not present

## 2019-08-16 DIAGNOSIS — M359 Systemic involvement of connective tissue, unspecified: Secondary | ICD-10-CM | POA: Diagnosis not present

## 2019-08-29 DIAGNOSIS — H04123 Dry eye syndrome of bilateral lacrimal glands: Secondary | ICD-10-CM | POA: Diagnosis not present

## 2019-08-29 DIAGNOSIS — Z79899 Other long term (current) drug therapy: Secondary | ICD-10-CM | POA: Diagnosis not present

## 2019-08-29 DIAGNOSIS — M359 Systemic involvement of connective tissue, unspecified: Secondary | ICD-10-CM | POA: Diagnosis not present

## 2019-08-29 DIAGNOSIS — H40013 Open angle with borderline findings, low risk, bilateral: Secondary | ICD-10-CM | POA: Diagnosis not present

## 2019-08-30 DIAGNOSIS — M533 Sacrococcygeal disorders, not elsewhere classified: Secondary | ICD-10-CM | POA: Diagnosis not present

## 2019-09-07 DIAGNOSIS — M351 Other overlap syndromes: Secondary | ICD-10-CM | POA: Diagnosis not present

## 2019-09-07 DIAGNOSIS — G4700 Insomnia, unspecified: Secondary | ICD-10-CM | POA: Diagnosis not present

## 2019-09-07 DIAGNOSIS — I10 Essential (primary) hypertension: Secondary | ICD-10-CM | POA: Diagnosis not present

## 2019-09-07 DIAGNOSIS — F419 Anxiety disorder, unspecified: Secondary | ICD-10-CM | POA: Diagnosis not present

## 2019-09-19 DIAGNOSIS — L821 Other seborrheic keratosis: Secondary | ICD-10-CM | POA: Diagnosis not present

## 2019-09-19 DIAGNOSIS — I788 Other diseases of capillaries: Secondary | ICD-10-CM | POA: Diagnosis not present

## 2019-09-19 DIAGNOSIS — D485 Neoplasm of uncertain behavior of skin: Secondary | ICD-10-CM | POA: Diagnosis not present

## 2019-09-19 DIAGNOSIS — L720 Epidermal cyst: Secondary | ICD-10-CM | POA: Diagnosis not present

## 2019-09-22 DIAGNOSIS — F331 Major depressive disorder, recurrent, moderate: Secondary | ICD-10-CM | POA: Diagnosis not present

## 2019-09-26 DIAGNOSIS — R300 Dysuria: Secondary | ICD-10-CM | POA: Diagnosis not present

## 2019-09-26 DIAGNOSIS — F331 Major depressive disorder, recurrent, moderate: Secondary | ICD-10-CM | POA: Diagnosis not present

## 2019-10-13 DIAGNOSIS — F331 Major depressive disorder, recurrent, moderate: Secondary | ICD-10-CM | POA: Diagnosis not present

## 2019-10-18 DIAGNOSIS — M7712 Lateral epicondylitis, left elbow: Secondary | ICD-10-CM | POA: Diagnosis not present

## 2019-10-18 DIAGNOSIS — M25522 Pain in left elbow: Secondary | ICD-10-CM | POA: Diagnosis not present

## 2019-10-21 DIAGNOSIS — M25522 Pain in left elbow: Secondary | ICD-10-CM | POA: Diagnosis not present

## 2019-10-28 DIAGNOSIS — M064 Inflammatory polyarthropathy: Secondary | ICD-10-CM | POA: Diagnosis not present

## 2019-10-28 DIAGNOSIS — K219 Gastro-esophageal reflux disease without esophagitis: Secondary | ICD-10-CM | POA: Diagnosis not present

## 2019-10-28 DIAGNOSIS — I7300 Raynaud's syndrome without gangrene: Secondary | ICD-10-CM | POA: Diagnosis not present

## 2019-10-28 DIAGNOSIS — M351 Other overlap syndromes: Secondary | ICD-10-CM | POA: Diagnosis not present

## 2019-10-28 DIAGNOSIS — Z1159 Encounter for screening for other viral diseases: Secondary | ICD-10-CM | POA: Diagnosis not present

## 2019-10-28 DIAGNOSIS — M8589 Other specified disorders of bone density and structure, multiple sites: Secondary | ICD-10-CM | POA: Diagnosis not present

## 2019-10-28 DIAGNOSIS — M3501 Sicca syndrome with keratoconjunctivitis: Secondary | ICD-10-CM | POA: Diagnosis not present

## 2019-11-02 DIAGNOSIS — F331 Major depressive disorder, recurrent, moderate: Secondary | ICD-10-CM | POA: Diagnosis not present

## 2019-11-15 DIAGNOSIS — I7300 Raynaud's syndrome without gangrene: Secondary | ICD-10-CM | POA: Diagnosis not present

## 2019-11-15 DIAGNOSIS — M3501 Sicca syndrome with keratoconjunctivitis: Secondary | ICD-10-CM | POA: Diagnosis not present

## 2019-11-15 DIAGNOSIS — K219 Gastro-esophageal reflux disease without esophagitis: Secondary | ICD-10-CM | POA: Diagnosis not present

## 2019-11-15 DIAGNOSIS — M351 Other overlap syndromes: Secondary | ICD-10-CM | POA: Diagnosis not present

## 2019-11-16 DIAGNOSIS — F331 Major depressive disorder, recurrent, moderate: Secondary | ICD-10-CM | POA: Diagnosis not present

## 2019-11-23 DIAGNOSIS — F331 Major depressive disorder, recurrent, moderate: Secondary | ICD-10-CM | POA: Diagnosis not present

## 2019-11-29 DIAGNOSIS — D1801 Hemangioma of skin and subcutaneous tissue: Secondary | ICD-10-CM | POA: Diagnosis not present

## 2019-11-29 DIAGNOSIS — L821 Other seborrheic keratosis: Secondary | ICD-10-CM | POA: Diagnosis not present

## 2019-11-29 DIAGNOSIS — D225 Melanocytic nevi of trunk: Secondary | ICD-10-CM | POA: Diagnosis not present

## 2019-11-29 DIAGNOSIS — D2262 Melanocytic nevi of left upper limb, including shoulder: Secondary | ICD-10-CM | POA: Diagnosis not present

## 2019-12-02 DIAGNOSIS — F331 Major depressive disorder, recurrent, moderate: Secondary | ICD-10-CM | POA: Diagnosis not present

## 2019-12-09 DIAGNOSIS — F331 Major depressive disorder, recurrent, moderate: Secondary | ICD-10-CM | POA: Diagnosis not present

## 2019-12-16 DIAGNOSIS — F331 Major depressive disorder, recurrent, moderate: Secondary | ICD-10-CM | POA: Diagnosis not present

## 2019-12-19 DIAGNOSIS — F5101 Primary insomnia: Secondary | ICD-10-CM | POA: Diagnosis not present

## 2019-12-19 DIAGNOSIS — M351 Other overlap syndromes: Secondary | ICD-10-CM | POA: Diagnosis not present

## 2019-12-19 DIAGNOSIS — I10 Essential (primary) hypertension: Secondary | ICD-10-CM | POA: Diagnosis not present

## 2019-12-19 DIAGNOSIS — F419 Anxiety disorder, unspecified: Secondary | ICD-10-CM | POA: Diagnosis not present

## 2019-12-22 DIAGNOSIS — F331 Major depressive disorder, recurrent, moderate: Secondary | ICD-10-CM | POA: Diagnosis not present

## 2020-01-02 DIAGNOSIS — F331 Major depressive disorder, recurrent, moderate: Secondary | ICD-10-CM | POA: Diagnosis not present

## 2020-01-23 DIAGNOSIS — F331 Major depressive disorder, recurrent, moderate: Secondary | ICD-10-CM | POA: Diagnosis not present

## 2020-02-07 DIAGNOSIS — F331 Major depressive disorder, recurrent, moderate: Secondary | ICD-10-CM | POA: Diagnosis not present

## 2020-02-16 DIAGNOSIS — F331 Major depressive disorder, recurrent, moderate: Secondary | ICD-10-CM | POA: Diagnosis not present

## 2020-02-21 DIAGNOSIS — F331 Major depressive disorder, recurrent, moderate: Secondary | ICD-10-CM | POA: Diagnosis not present

## 2020-03-02 DIAGNOSIS — H40013 Open angle with borderline findings, low risk, bilateral: Secondary | ICD-10-CM | POA: Diagnosis not present

## 2020-03-02 DIAGNOSIS — H04123 Dry eye syndrome of bilateral lacrimal glands: Secondary | ICD-10-CM | POA: Diagnosis not present

## 2020-03-02 DIAGNOSIS — Z79899 Other long term (current) drug therapy: Secondary | ICD-10-CM | POA: Diagnosis not present

## 2020-03-02 DIAGNOSIS — M359 Systemic involvement of connective tissue, unspecified: Secondary | ICD-10-CM | POA: Diagnosis not present

## 2020-03-05 DIAGNOSIS — F331 Major depressive disorder, recurrent, moderate: Secondary | ICD-10-CM | POA: Diagnosis not present

## 2020-03-13 DIAGNOSIS — M351 Other overlap syndromes: Secondary | ICD-10-CM | POA: Diagnosis not present

## 2020-03-13 DIAGNOSIS — K219 Gastro-esophageal reflux disease without esophagitis: Secondary | ICD-10-CM | POA: Diagnosis not present

## 2020-03-13 DIAGNOSIS — I7300 Raynaud's syndrome without gangrene: Secondary | ICD-10-CM | POA: Diagnosis not present

## 2020-03-13 DIAGNOSIS — M3501 Sicca syndrome with keratoconjunctivitis: Secondary | ICD-10-CM | POA: Diagnosis not present

## 2020-03-21 DIAGNOSIS — I10 Essential (primary) hypertension: Secondary | ICD-10-CM | POA: Diagnosis not present

## 2020-03-21 DIAGNOSIS — F419 Anxiety disorder, unspecified: Secondary | ICD-10-CM | POA: Diagnosis not present

## 2020-03-21 DIAGNOSIS — M351 Other overlap syndromes: Secondary | ICD-10-CM | POA: Diagnosis not present

## 2020-04-03 DIAGNOSIS — F331 Major depressive disorder, recurrent, moderate: Secondary | ICD-10-CM | POA: Diagnosis not present

## 2020-04-17 DIAGNOSIS — F331 Major depressive disorder, recurrent, moderate: Secondary | ICD-10-CM | POA: Diagnosis not present

## 2020-04-25 DIAGNOSIS — F331 Major depressive disorder, recurrent, moderate: Secondary | ICD-10-CM | POA: Diagnosis not present

## 2020-05-07 DIAGNOSIS — F331 Major depressive disorder, recurrent, moderate: Secondary | ICD-10-CM | POA: Diagnosis not present

## 2020-05-08 DIAGNOSIS — H16002 Unspecified corneal ulcer, left eye: Secondary | ICD-10-CM | POA: Diagnosis not present

## 2020-05-08 DIAGNOSIS — H04123 Dry eye syndrome of bilateral lacrimal glands: Secondary | ICD-10-CM | POA: Diagnosis not present

## 2020-05-10 DIAGNOSIS — Z6820 Body mass index (BMI) 20.0-20.9, adult: Secondary | ICD-10-CM | POA: Diagnosis not present

## 2020-05-10 DIAGNOSIS — H16002 Unspecified corneal ulcer, left eye: Secondary | ICD-10-CM | POA: Diagnosis not present

## 2020-05-10 DIAGNOSIS — Z01419 Encounter for gynecological examination (general) (routine) without abnormal findings: Secondary | ICD-10-CM | POA: Diagnosis not present

## 2020-05-10 DIAGNOSIS — Z1231 Encounter for screening mammogram for malignant neoplasm of breast: Secondary | ICD-10-CM | POA: Diagnosis not present

## 2020-05-11 DIAGNOSIS — F331 Major depressive disorder, recurrent, moderate: Secondary | ICD-10-CM | POA: Diagnosis not present

## 2020-05-23 DIAGNOSIS — F331 Major depressive disorder, recurrent, moderate: Secondary | ICD-10-CM | POA: Diagnosis not present

## 2020-05-30 DIAGNOSIS — F331 Major depressive disorder, recurrent, moderate: Secondary | ICD-10-CM | POA: Diagnosis not present

## 2020-06-14 DIAGNOSIS — F331 Major depressive disorder, recurrent, moderate: Secondary | ICD-10-CM | POA: Diagnosis not present

## 2020-06-26 DIAGNOSIS — M25511 Pain in right shoulder: Secondary | ICD-10-CM | POA: Diagnosis not present

## 2020-06-26 DIAGNOSIS — M542 Cervicalgia: Secondary | ICD-10-CM | POA: Diagnosis not present

## 2020-07-10 DIAGNOSIS — L309 Dermatitis, unspecified: Secondary | ICD-10-CM | POA: Diagnosis not present

## 2020-07-13 DIAGNOSIS — Z03818 Encounter for observation for suspected exposure to other biological agents ruled out: Secondary | ICD-10-CM | POA: Diagnosis not present

## 2020-07-13 DIAGNOSIS — J988 Other specified respiratory disorders: Secondary | ICD-10-CM | POA: Diagnosis not present

## 2020-08-02 DIAGNOSIS — H16141 Punctate keratitis, right eye: Secondary | ICD-10-CM | POA: Diagnosis not present

## 2020-08-02 DIAGNOSIS — H04123 Dry eye syndrome of bilateral lacrimal glands: Secondary | ICD-10-CM | POA: Diagnosis not present

## 2020-08-16 DIAGNOSIS — M359 Systemic involvement of connective tissue, unspecified: Secondary | ICD-10-CM | POA: Diagnosis not present

## 2020-08-20 DIAGNOSIS — N289 Disorder of kidney and ureter, unspecified: Secondary | ICD-10-CM | POA: Diagnosis not present

## 2020-09-19 DIAGNOSIS — F419 Anxiety disorder, unspecified: Secondary | ICD-10-CM | POA: Diagnosis not present

## 2020-09-19 DIAGNOSIS — M62838 Other muscle spasm: Secondary | ICD-10-CM | POA: Diagnosis not present

## 2020-09-19 DIAGNOSIS — I10 Essential (primary) hypertension: Secondary | ICD-10-CM | POA: Diagnosis not present

## 2020-10-31 DIAGNOSIS — R293 Abnormal posture: Secondary | ICD-10-CM | POA: Diagnosis not present

## 2020-10-31 DIAGNOSIS — M791 Myalgia, unspecified site: Secondary | ICD-10-CM | POA: Diagnosis not present

## 2020-10-31 DIAGNOSIS — M542 Cervicalgia: Secondary | ICD-10-CM | POA: Diagnosis not present

## 2020-10-31 DIAGNOSIS — M545 Low back pain, unspecified: Secondary | ICD-10-CM | POA: Diagnosis not present

## 2020-11-05 DIAGNOSIS — F4320 Adjustment disorder, unspecified: Secondary | ICD-10-CM | POA: Diagnosis not present

## 2020-11-07 DIAGNOSIS — R293 Abnormal posture: Secondary | ICD-10-CM | POA: Diagnosis not present

## 2020-11-07 DIAGNOSIS — M791 Myalgia, unspecified site: Secondary | ICD-10-CM | POA: Diagnosis not present

## 2020-11-07 DIAGNOSIS — M545 Low back pain, unspecified: Secondary | ICD-10-CM | POA: Diagnosis not present

## 2020-11-07 DIAGNOSIS — M542 Cervicalgia: Secondary | ICD-10-CM | POA: Diagnosis not present

## 2020-11-09 DIAGNOSIS — M542 Cervicalgia: Secondary | ICD-10-CM | POA: Diagnosis not present

## 2020-11-09 DIAGNOSIS — M545 Low back pain, unspecified: Secondary | ICD-10-CM | POA: Diagnosis not present

## 2020-11-09 DIAGNOSIS — M791 Myalgia, unspecified site: Secondary | ICD-10-CM | POA: Diagnosis not present

## 2020-11-09 DIAGNOSIS — R293 Abnormal posture: Secondary | ICD-10-CM | POA: Diagnosis not present

## 2020-11-14 DIAGNOSIS — M545 Low back pain, unspecified: Secondary | ICD-10-CM | POA: Diagnosis not present

## 2020-11-14 DIAGNOSIS — R293 Abnormal posture: Secondary | ICD-10-CM | POA: Diagnosis not present

## 2020-11-14 DIAGNOSIS — F4320 Adjustment disorder, unspecified: Secondary | ICD-10-CM | POA: Diagnosis not present

## 2020-11-14 DIAGNOSIS — M791 Myalgia, unspecified site: Secondary | ICD-10-CM | POA: Diagnosis not present

## 2020-11-14 DIAGNOSIS — M542 Cervicalgia: Secondary | ICD-10-CM | POA: Diagnosis not present

## 2020-11-20 DIAGNOSIS — Z79899 Other long term (current) drug therapy: Secondary | ICD-10-CM | POA: Diagnosis not present

## 2020-11-20 DIAGNOSIS — H04123 Dry eye syndrome of bilateral lacrimal glands: Secondary | ICD-10-CM | POA: Diagnosis not present

## 2020-11-20 DIAGNOSIS — H40013 Open angle with borderline findings, low risk, bilateral: Secondary | ICD-10-CM | POA: Diagnosis not present

## 2020-11-20 DIAGNOSIS — M359 Systemic involvement of connective tissue, unspecified: Secondary | ICD-10-CM | POA: Diagnosis not present

## 2020-11-23 DIAGNOSIS — M542 Cervicalgia: Secondary | ICD-10-CM | POA: Diagnosis not present

## 2020-11-23 DIAGNOSIS — R293 Abnormal posture: Secondary | ICD-10-CM | POA: Diagnosis not present

## 2020-11-23 DIAGNOSIS — M545 Low back pain, unspecified: Secondary | ICD-10-CM | POA: Diagnosis not present

## 2020-11-23 DIAGNOSIS — M791 Myalgia, unspecified site: Secondary | ICD-10-CM | POA: Diagnosis not present

## 2020-11-26 DIAGNOSIS — F4320 Adjustment disorder, unspecified: Secondary | ICD-10-CM | POA: Diagnosis not present

## 2020-12-20 DIAGNOSIS — F4320 Adjustment disorder, unspecified: Secondary | ICD-10-CM | POA: Diagnosis not present

## 2020-12-26 DIAGNOSIS — D2222 Melanocytic nevi of left ear and external auricular canal: Secondary | ICD-10-CM | POA: Diagnosis not present

## 2020-12-26 DIAGNOSIS — L821 Other seborrheic keratosis: Secondary | ICD-10-CM | POA: Diagnosis not present

## 2020-12-26 DIAGNOSIS — L738 Other specified follicular disorders: Secondary | ICD-10-CM | POA: Diagnosis not present

## 2020-12-26 DIAGNOSIS — D225 Melanocytic nevi of trunk: Secondary | ICD-10-CM | POA: Diagnosis not present

## 2021-01-07 DIAGNOSIS — M791 Myalgia, unspecified site: Secondary | ICD-10-CM | POA: Diagnosis not present

## 2021-01-07 DIAGNOSIS — M545 Low back pain, unspecified: Secondary | ICD-10-CM | POA: Diagnosis not present

## 2021-01-07 DIAGNOSIS — R293 Abnormal posture: Secondary | ICD-10-CM | POA: Diagnosis not present

## 2021-01-07 DIAGNOSIS — M542 Cervicalgia: Secondary | ICD-10-CM | POA: Diagnosis not present

## 2021-01-11 DIAGNOSIS — F4320 Adjustment disorder, unspecified: Secondary | ICD-10-CM | POA: Diagnosis not present

## 2021-02-11 DIAGNOSIS — F4320 Adjustment disorder, unspecified: Secondary | ICD-10-CM | POA: Diagnosis not present

## 2021-02-13 DIAGNOSIS — M791 Myalgia, unspecified site: Secondary | ICD-10-CM | POA: Diagnosis not present

## 2021-02-13 DIAGNOSIS — M542 Cervicalgia: Secondary | ICD-10-CM | POA: Diagnosis not present

## 2021-02-13 DIAGNOSIS — R293 Abnormal posture: Secondary | ICD-10-CM | POA: Diagnosis not present

## 2021-02-13 DIAGNOSIS — M545 Low back pain, unspecified: Secondary | ICD-10-CM | POA: Diagnosis not present

## 2021-02-22 DIAGNOSIS — F4320 Adjustment disorder, unspecified: Secondary | ICD-10-CM | POA: Diagnosis not present

## 2021-02-25 DIAGNOSIS — F4320 Adjustment disorder, unspecified: Secondary | ICD-10-CM | POA: Diagnosis not present

## 2021-02-26 DIAGNOSIS — L237 Allergic contact dermatitis due to plants, except food: Secondary | ICD-10-CM | POA: Diagnosis not present

## 2021-03-06 DIAGNOSIS — F4320 Adjustment disorder, unspecified: Secondary | ICD-10-CM | POA: Diagnosis not present

## 2021-03-20 DIAGNOSIS — M542 Cervicalgia: Secondary | ICD-10-CM | POA: Diagnosis not present

## 2021-03-26 DIAGNOSIS — I10 Essential (primary) hypertension: Secondary | ICD-10-CM | POA: Diagnosis not present

## 2021-03-26 DIAGNOSIS — M351 Other overlap syndromes: Secondary | ICD-10-CM | POA: Diagnosis not present

## 2021-03-26 DIAGNOSIS — F419 Anxiety disorder, unspecified: Secondary | ICD-10-CM | POA: Diagnosis not present

## 2021-03-26 DIAGNOSIS — Z1159 Encounter for screening for other viral diseases: Secondary | ICD-10-CM | POA: Diagnosis not present

## 2021-04-01 DIAGNOSIS — I10 Essential (primary) hypertension: Secondary | ICD-10-CM | POA: Diagnosis not present

## 2021-04-01 DIAGNOSIS — Z1159 Encounter for screening for other viral diseases: Secondary | ICD-10-CM | POA: Diagnosis not present

## 2021-04-08 DIAGNOSIS — F4320 Adjustment disorder, unspecified: Secondary | ICD-10-CM | POA: Diagnosis not present

## 2021-04-22 DIAGNOSIS — F4320 Adjustment disorder, unspecified: Secondary | ICD-10-CM | POA: Diagnosis not present

## 2021-04-29 DIAGNOSIS — F4320 Adjustment disorder, unspecified: Secondary | ICD-10-CM | POA: Diagnosis not present

## 2021-05-15 DIAGNOSIS — B079 Viral wart, unspecified: Secondary | ICD-10-CM | POA: Diagnosis not present

## 2021-05-15 DIAGNOSIS — Z23 Encounter for immunization: Secondary | ICD-10-CM | POA: Diagnosis not present

## 2021-05-16 DIAGNOSIS — F4320 Adjustment disorder, unspecified: Secondary | ICD-10-CM | POA: Diagnosis not present

## 2021-05-27 DIAGNOSIS — F4320 Adjustment disorder, unspecified: Secondary | ICD-10-CM | POA: Diagnosis not present

## 2021-06-03 DIAGNOSIS — Z01419 Encounter for gynecological examination (general) (routine) without abnormal findings: Secondary | ICD-10-CM | POA: Diagnosis not present

## 2021-06-03 DIAGNOSIS — Z6821 Body mass index (BMI) 21.0-21.9, adult: Secondary | ICD-10-CM | POA: Diagnosis not present

## 2021-06-03 DIAGNOSIS — Z1231 Encounter for screening mammogram for malignant neoplasm of breast: Secondary | ICD-10-CM | POA: Diagnosis not present

## 2021-06-03 DIAGNOSIS — Z13820 Encounter for screening for osteoporosis: Secondary | ICD-10-CM | POA: Diagnosis not present

## 2021-06-14 DIAGNOSIS — H40013 Open angle with borderline findings, low risk, bilateral: Secondary | ICD-10-CM | POA: Diagnosis not present

## 2021-06-14 DIAGNOSIS — M359 Systemic involvement of connective tissue, unspecified: Secondary | ICD-10-CM | POA: Diagnosis not present

## 2021-06-14 DIAGNOSIS — Z79899 Other long term (current) drug therapy: Secondary | ICD-10-CM | POA: Diagnosis not present

## 2021-06-24 DIAGNOSIS — M810 Age-related osteoporosis without current pathological fracture: Secondary | ICD-10-CM | POA: Diagnosis not present

## 2021-07-11 DIAGNOSIS — F4320 Adjustment disorder, unspecified: Secondary | ICD-10-CM | POA: Diagnosis not present

## 2022-05-15 ENCOUNTER — Other Ambulatory Visit: Payer: Self-pay | Admitting: Orthopaedic Surgery

## 2022-05-15 DIAGNOSIS — S9031XA Contusion of right foot, initial encounter: Secondary | ICD-10-CM

## 2022-05-21 ENCOUNTER — Ambulatory Visit
Admission: RE | Admit: 2022-05-21 | Discharge: 2022-05-21 | Disposition: A | Discharge status: Home / Self Care | Payer: BC Managed Care – PPO | Source: Ambulatory Visit | Attending: Orthopaedic Surgery | Admitting: Orthopaedic Surgery

## 2022-05-21 DIAGNOSIS — S9031XA Contusion of right foot, initial encounter: Secondary | ICD-10-CM

## 2023-02-22 ENCOUNTER — Encounter: Payer: Self-pay | Admitting: Cardiovascular Disease

## 2023-02-22 NOTE — Progress Notes (Unsigned)
  Cardiology Office Note:  .   Date:  02/23/2023  ID:  Crystal Davenport, DOB 03/10/1966, MRN [id number] PCP: Julio Sicks, NP  [facility] Providers Cardiologist:  Nathanyl Andujo  Click to update primary MD,subspecialty MD or APP then REFRESH:1}   History of Present Illness: .   Crystal Davenport is a 57 y.o. 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   Hx of HTN for years,          ROS:   Studies Reviewed: Marland Kitchen   EKG Interpretation Date/Time:  Monday February 23 2023 15:30:54 EDT Ventricular Rate:  78 PR Interval:  156 QRS Duration:  94 QT Interval:  384 QTC Calculation: 437 R Axis:   55  Text Interpretation: Normal sinus rhythm Normal ECG When compared with ECG of 11-Oct-2013 19:46, no significant changes Confirmed by Kristeen Miss ([id number]) on 02/23/2023 3:52:37 PM     Risk Assessment/Calculations:             Physical Exam:   VS:  BP 122/70   Pulse 78   Ht 5\' 5"  (1.651 m)   Wt 130 lb 3.2 oz (59.1 kg)   SpO2 94%   BMI 21.67 kg/m    Wt Readings from Last 3 Encounters:  02/23/23 130 lb 3.2 oz (59.1 kg)  08/07/15 125 lb 8 oz (56.9 kg)    GEN: Well nourished, well developed in no acute distress NECK: No JVD; No carotid bruits CARDIAC: RR , very soft systolic murmur  RESPIRATORY:  Clear to auscultation without rales, wheezing or rhonchi  ABDOMEN: Soft, non-tender, non-distended EXTREMITIES:  No edema; No deformity  Skin:   tan skin  ( no electrolyte abnormalities to suggest Addisons ) .    ASSESSMENT AND PLAN: .   Heart murmur: [name] presents for further evaluation of a heart murmur.  On exam she is a very trivial systolic murmur consistent  with either mitral regurgitation or tricuspid regurgitation.  Will get an echo for further evaluation  Will see her on an as needed basis unless the echo shows something concerning or if she has further questions.          Dispo: PRN   Signed, Kristeen Miss, MD

## 2023-02-23 ENCOUNTER — Ambulatory Visit: Payer: BC Managed Care – PPO | Attending: Cardiovascular Disease | Admitting: Cardiovascular Disease

## 2023-02-23 ENCOUNTER — Encounter: Payer: Self-pay | Admitting: Cardiovascular Disease

## 2023-02-23 VITALS — BP 122/70 | HR 78 | Ht 65.0 in | Wt 130.2 lb

## 2023-02-23 DIAGNOSIS — R011 Cardiac murmur, unspecified: Secondary | ICD-10-CM | POA: Diagnosis not present

## 2023-02-23 NOTE — Patient Instructions (Signed)
Medication Instructions:  Your physician recommends that you continue on your current medications as directed. Please refer to the Current Medication list given to you today.  *If you need a refill on your cardiac medications before your next appointment, please call your pharmacy*  Lab Work: NONE If you have labs (blood work) drawn today and your tests are completely normal, you will receive your results only by: MyChart Message (if you have MyChart) OR A paper copy in the mail If you have any lab test that is abnormal or we need to change your treatment, we will call you to review the results.  Testing/Procedures: ECHO Your physician has requested that you have an echocardiogram. Echocardiography is a painless test that uses sound waves to create images of your heart. It provides your doctor with information about the size and shape of your heart and how well your heart's chambers and valves are working. This procedure takes approximately one hour. There are no restrictions for this procedure. Please do NOT wear cologne, perfume, aftershave, or lotions (deodorant is allowed). Please arrive 15 minutes prior to your appointment time.  Follow-Up: At [facility], you and your health needs are our priority.  As part of our continuing mission to provide you with exceptional heart care, we have created designated Provider Care Teams.  These Care Teams include your primary Cardiologist (physician) and Advanced Practice Providers (APPs -  Physician Assistants and Nurse Practitioners) who all work together to provide you with the care you need, when you need it.  Your next appointment:   As Needed  Provider:   Kristeen Miss, MD

## 2023-02-26 ENCOUNTER — Ambulatory Visit (HOSPITAL_COMMUNITY): Payer: BC Managed Care – PPO | Attending: Cardiovascular Disease

## 2023-02-26 DIAGNOSIS — R011 Cardiac murmur, unspecified: Secondary | ICD-10-CM | POA: Diagnosis not present

## 2023-02-26 LAB — ECHOCARDIOGRAM COMPLETE
Area-P 1/2: 4.15 cm2
Calc EF: 60.5 %
S' Lateral: 2.3 cm
Single Plane A2C EF: 59.4 %
Single Plane A4C EF: 61.4 %

## 2023-03-10 ENCOUNTER — Other Ambulatory Visit (HOSPITAL_COMMUNITY): Payer: BC Managed Care – PPO

## 2023-06-10 ENCOUNTER — Encounter (HOSPITAL_BASED_OUTPATIENT_CLINIC_OR_DEPARTMENT_OTHER): Payer: Self-pay | Admitting: Emergency Medicine

## 2023-06-10 ENCOUNTER — Emergency Department (HOSPITAL_BASED_OUTPATIENT_CLINIC_OR_DEPARTMENT_OTHER)
Admission: EM | Admit: 2023-06-10 | Discharge: 2023-06-10 | Disposition: A | Discharge status: Home / Self Care | Payer: BC Managed Care – PPO | Attending: Emergency Medicine | Admitting: Emergency Medicine

## 2023-06-10 ENCOUNTER — Emergency Department (HOSPITAL_BASED_OUTPATIENT_CLINIC_OR_DEPARTMENT_OTHER): Payer: BC Managed Care – PPO

## 2023-06-10 DIAGNOSIS — S61452A Open bite of left hand, initial encounter: Secondary | ICD-10-CM | POA: Diagnosis present

## 2023-06-10 DIAGNOSIS — W540XXA Bitten by dog, initial encounter: Secondary | ICD-10-CM | POA: Insufficient documentation

## 2023-06-10 MED ORDER — AMOXICILLIN-POT CLAVULANATE 875-125 MG PO TABS: 1.0000 | ORAL_TABLET | Freq: Two times a day (BID) | ORAL | 0 refills | Status: AC

## 2023-06-10 MED ORDER — AMOXICILLIN-POT CLAVULANATE 875-125 MG PO TABS
1.0000 | ORAL_TABLET | Freq: Once | ORAL | Status: AC
  Administered 2023-06-10: 1 via ORAL
  Filled 2023-06-10: qty 1

## 2023-06-10 NOTE — ED Triage Notes (Signed)
States her dog bit her left index finger. Small puncture wound to finger. States dog is up to date with vaccinations.

## 2023-06-10 NOTE — ED Notes (Signed)
Pt verbalized understanding of d/c instructions, meds, and followup care. Denies questions. VSS, no distress noted. Steady gait to exit with all belongings.  ?

## 2023-06-10 NOTE — Discharge Instructions (Addendum)
You were seen in the emergency department for a dog bite to your left hand The wound was washed out and a compressive dressing was applied In order to prevent infection we gave you your first dose of antibiotics here and called the rest into your pharmacy for you to pick up and begin taking twice a day as directed for the next 7 days Be on the look out for signs of infection such as severe pain, inability to move your finger, increased redness or draining pus If you experience any of these return to the emergency department at once Otherwise please follow-up with your primary care doctor 1 week for reevaluation

## 2023-06-10 NOTE — ED Provider Notes (Signed)
[place] EMERGENCY DEPARTMENT AT [facility] Provider Note   CSN: [id number] Arrival date & time: 06/10/23  1732     History  Chief Complaint  Patient presents with   Animal Bite    Crystal Davenport is a 57 y.o. female.  Who presents the ED after dog bite.  Patient was reaching in her small dog's cage at home and was bitten in her left hand.  Now with a puncture wound to the left index finger.  No other injuries.  Dog is up-to-date on vaccinations.  Tetanus up-to-date.   Animal Bite      Home Medications Prior to Admission medications   Medication Sig Start Date End Date Taking? Authorizing Provider  amoxicillin-clavulanate (AUGMENTIN) 875-125 MG tablet Take 1 tablet by mouth every 12 (twelve) hours. 06/10/23  Yes Royanne Foots, DO  ALPRAZolam Prudy Feeler) 0.5 MG tablet Take 0.5 mg by mouth at bedtime as needed for anxiety.     [provider]  amLODipine (NORVASC) 5 MG tablet Take 5 mg by mouth daily.    [provider]  Calcium Carb-Cholecalciferol 600-10 MG-MCG TABS Take by mouth in the morning and at bedtime.    [provider]  celecoxib (CELEBREX) 100 MG capsule as needed for moderate pain. 05/29/22   [provider]  cevimeline (EVOXAC) 30 MG capsule 1 capsule in the morning and at bedtime. 05/29/22   [provider]  Cholecalciferol (VITAMIN D3) 2000 units TABS Take 1 capsule by mouth daily.    [provider]  cyclobenzaprine (FLEXERIL) 5 MG tablet Take 5 mg by mouth 3 (three) times daily as needed for muscle spasms.    [provider]  diclofenac Sodium (VOLTAREN) 1 % GEL as needed (pain). 06/12/22   [provider]  fluticasone (FLONASE) 50 MCG/ACT nasal spray Place 1 spray into both nostrils daily.    [provider]  hydroxychloroquine (PLAQUENIL) 200 MG tablet Take 200 mg by mouth every morning.    [provider]  mirabegron ER (MYRBETRIQ) 50 MG TB24 tablet Take 50 mg  by mouth daily.    [provider]  Misc Natural Products (TURMERIC CURCUMIN) CAPS daily at 6 (six) AM.    [provider]  risedronate (ACTONEL) 150 MG tablet every 30 (thirty) days. 12/02/22   [provider]  traZODone (DESYREL) 50 MG tablet as needed for sleep. 11/28/21   [provider]  Turmeric (CURCUPLEX-95) 500 MG CAPS Take by mouth daily at 6 (six) AM.    [provider]  vitamin B-12 (CYANOCOBALAMIN) 1000 MCG tablet Take 1,000 mcg by mouth daily.    [provider]      Allergies    Codeine    Review of Systems   Review of Systems  Physical Exam Updated Vital Signs BP (!) 157/92 (BP Location: Right Arm)   Pulse 77   Temp 98.2 F (36.8 C)   Resp 18   Ht 5\' 5"  (1.651 m)   Wt 61.2 kg   SpO2 100%   BMI 22.47 kg/m  Physical Exam Vitals and nursing note reviewed.  HENT:     Head: Normocephalic and atraumatic.  Eyes:     Pupils: Pupils are equal, round, and reactive to light.  Cardiovascular:     Rate and Rhythm: Normal rate and regular rhythm.  Pulmonary:     Effort: Pulmonary effort is normal.     Breath sounds: Normal breath sounds.  Abdominal:     Palpations: Abdomen is  soft.     Tenderness: There is no abdominal tenderness.  Musculoskeletal:     Comments: Small puncture wound with minimal active bleeding over palmar surface of left index finger mid distal phalanx Sensation intact to light touch Good cap refill in all fingers left hand Able to flex and extend left index finger with no restriction or significant discomfort   Skin:    General: Skin is warm and dry.  Neurological:     Mental Status: She is alert.  Psychiatric:        Mood and Affect: Mood normal.     ED Results / Procedures / Treatments   Labs (all labs ordered are listed, but only abnormal results are displayed) Labs Reviewed - No data to display  EKG None  Radiology No results found.  Procedures Procedures    Medications  Ordered in ED Medications  amoxicillin-clavulanate (AUGMENTIN) 875-125 MG per tablet 1 tablet (1 tablet Oral Given 06/10/23 1810)    ED Course/ Medical Decision Making/ A&P                                 Medical Decision Making 57 year old female presenting after dog bite.  Was bitten by her small dog at home.  Dog is up-to-date on immunizations.  Her tetanus is up-to-date.  Small puncture wound over the left hand.  She is right-hand dominant.  Neurovasculature intact with no evidence of tendon involvement.  No need for repair with suturing.  Applied compressive gauze dressing after thorough irrigation.  Will provide her first dose of Augmentin here and discharge her with a course of antibiotics.  Signs and symptoms worrisome for infected wound were discussed with the patient in detail.  She will follow-up with her primary care doctor within the next week for reevaluation.  Amount and/or Complexity of Data Reviewed Radiology: ordered.  Risk Prescription drug management.           Final Clinical Impression(s) / ED Diagnoses Final diagnoses:  Dog bite of left hand, initial encounter    Rx / DC Orders ED Discharge Orders          Ordered    amoxicillin-clavulanate (AUGMENTIN) 875-125 MG tablet  Every 12 hours        06/10/23 1824              Royanne Foots, DO 06/10/23 1826

## 2024-01-29 ENCOUNTER — Encounter: Payer: Self-pay | Admitting: Advanced Practice Midwife

## 2024-02-03 ENCOUNTER — Other Ambulatory Visit: Payer: Self-pay | Admitting: Medical Genetics

## 2024-02-10 ENCOUNTER — Other Ambulatory Visit (HOSPITAL_COMMUNITY)
Admission: RE | Admit: 2024-02-10 | Discharge: 2024-02-10 | Disposition: A | Discharge status: Home / Self Care | Payer: Self-pay | Source: Ambulatory Visit | Attending: Medical Genetics | Admitting: Medical Genetics

## 2024-02-20 LAB — GENECONNECT MOLECULAR SCREEN: Genetic Analysis Overall Interpretation: NEGATIVE

## 2024-04-19 ENCOUNTER — Telehealth: Payer: Self-pay | Admitting: Diagnostic Neuroimaging

## 2024-04-19 NOTE — Telephone Encounter (Signed)
 Received sleep referral for pt from [facility] @ [facility] Montie Pizza MD for abnormal leg movements in bed, to r/o sleep-related movement issues. Placed in sleep referrals box
